# Patient Record
Sex: Female | Born: 1945 | Race: White | Hispanic: No | Marital: Married | Smoking: Never smoker
Health system: Southern US, Community
[De-identification: ages and names within clinical notes are randomized; demographics above are authoritative.]

## PROBLEM LIST (undated history)

## (undated) DIAGNOSIS — M1990 Unspecified osteoarthritis, unspecified site: Secondary | ICD-10-CM

## (undated) DIAGNOSIS — J302 Other seasonal allergic rhinitis: Secondary | ICD-10-CM

## (undated) DIAGNOSIS — J45909 Unspecified asthma, uncomplicated: Secondary | ICD-10-CM

## (undated) DIAGNOSIS — E785 Hyperlipidemia, unspecified: Secondary | ICD-10-CM

## (undated) DIAGNOSIS — K219 Gastro-esophageal reflux disease without esophagitis: Secondary | ICD-10-CM

## (undated) DIAGNOSIS — E119 Type 2 diabetes mellitus without complications: Secondary | ICD-10-CM

## (undated) DIAGNOSIS — M62838 Other muscle spasm: Secondary | ICD-10-CM

## (undated) DIAGNOSIS — I10 Essential (primary) hypertension: Secondary | ICD-10-CM

## (undated) HISTORY — PX: SEPTOPLASTY: SUR1290

---

## 1988-10-23 HISTORY — PX: BREAST SURGERY: SHX581

## 2016-06-14 ENCOUNTER — Other Ambulatory Visit: Payer: Self-pay | Admitting: Gastroenterology

## 2016-06-14 DIAGNOSIS — R1084 Generalized abdominal pain: Secondary | ICD-10-CM

## 2016-06-20 ENCOUNTER — Ambulatory Visit
Admission: RE | Admit: 2016-06-20 | Discharge: 2016-06-20 | Disposition: A | Discharge status: Home / Self Care | Payer: Commercial Managed Care - PPO | Source: Ambulatory Visit | Attending: Gastroenterology | Admitting: Gastroenterology

## 2016-06-20 DIAGNOSIS — R1084 Generalized abdominal pain: Secondary | ICD-10-CM | POA: Insufficient documentation

## 2016-06-20 DIAGNOSIS — K5730 Diverticulosis of large intestine without perforation or abscess without bleeding: Secondary | ICD-10-CM | POA: Diagnosis not present

## 2016-06-20 DIAGNOSIS — I700 Atherosclerosis of aorta: Secondary | ICD-10-CM | POA: Diagnosis not present

## 2016-06-20 HISTORY — DX: Unspecified asthma, uncomplicated: J45.909

## 2016-06-20 HISTORY — DX: Type 2 diabetes mellitus without complications: E11.9

## 2016-06-20 MED ORDER — IOPAMIDOL (ISOVUE-300) INJECTION 61%
100.0000 mL | Freq: Once | INTRAVENOUS | Status: AC | PRN
  Administered 2016-06-20: 100 mL via INTRAVENOUS

## 2016-11-08 ENCOUNTER — Inpatient Hospital Stay: Admission: RE | Admit: 2016-11-08 | Payer: Commercial Managed Care - PPO | Source: Ambulatory Visit

## 2016-11-09 ENCOUNTER — Other Ambulatory Visit: Payer: Self-pay | Admitting: Orthopedic Surgery

## 2016-11-15 ENCOUNTER — Encounter
Admission: RE | Admit: 2016-11-15 | Discharge: 2016-11-15 | Disposition: A | Discharge status: Home / Self Care | Payer: Commercial Managed Care - PPO | Source: Ambulatory Visit | Attending: Orthopedic Surgery | Admitting: Orthopedic Surgery

## 2016-11-15 DIAGNOSIS — Z01812 Encounter for preprocedural laboratory examination: Secondary | ICD-10-CM | POA: Diagnosis not present

## 2016-11-15 HISTORY — DX: Other seasonal allergic rhinitis: J30.2

## 2016-11-15 HISTORY — DX: Other muscle spasm: M62.838

## 2016-11-15 HISTORY — DX: Hyperlipidemia, unspecified: E78.5

## 2016-11-15 HISTORY — DX: Unspecified osteoarthritis, unspecified site: M19.90

## 2016-11-15 HISTORY — DX: Gastro-esophageal reflux disease without esophagitis: K21.9

## 2016-11-15 LAB — CBC WITH DIFFERENTIAL/PLATELET
Basophils Absolute: 0.1 10*3/uL (ref 0–0.1)
Basophils Relative: 1 %
Eosinophils Absolute: 0.2 10*3/uL (ref 0–0.7)
Eosinophils Relative: 2 %
HEMATOCRIT: 39.5 % (ref 35.0–47.0)
Hemoglobin: 13.2 g/dL (ref 12.0–16.0)
LYMPHS PCT: 26 %
Lymphs Abs: 2.8 10*3/uL (ref 1.0–3.6)
MCH: 29.1 pg (ref 26.0–34.0)
MCHC: 33.5 g/dL (ref 32.0–36.0)
MCV: 86.8 fL (ref 80.0–100.0)
MONO ABS: 0.7 10*3/uL (ref 0.2–0.9)
MONOS PCT: 6 %
NEUTROS ABS: 7.1 10*3/uL — AB (ref 1.4–6.5)
Neutrophils Relative %: 65 %
Platelets: 306 10*3/uL (ref 150–440)
RBC: 4.55 MIL/uL (ref 3.80–5.20)
RDW: 14.1 % (ref 11.5–14.5)
WBC: 10.8 10*3/uL (ref 3.6–11.0)

## 2016-11-15 LAB — URINALYSIS, ROUTINE W REFLEX MICROSCOPIC
Bilirubin Urine: NEGATIVE
Glucose, UA: NEGATIVE mg/dL
HGB URINE DIPSTICK: NEGATIVE
KETONES UR: NEGATIVE mg/dL
LEUKOCYTES UA: NEGATIVE
Nitrite: NEGATIVE
PROTEIN: NEGATIVE mg/dL
Specific Gravity, Urine: 1.01 (ref 1.005–1.030)
pH: 5 (ref 5.0–8.0)

## 2016-11-15 LAB — BASIC METABOLIC PANEL
ANION GAP: 8 (ref 5–15)
BUN: 14 mg/dL (ref 6–20)
CHLORIDE: 106 mmol/L (ref 101–111)
CO2: 26 mmol/L (ref 22–32)
Calcium: 9.2 mg/dL (ref 8.9–10.3)
Creatinine, Ser: 0.73 mg/dL (ref 0.44–1.00)
GFR calc Af Amer: >60 mL/min (ref >60)
GFR calc non Af Amer: >60 mL/min (ref >60)
GLUCOSE: 94 mg/dL (ref 65–99)
POTASSIUM: 3.8 mmol/L (ref 3.5–5.1)
Sodium: 140 mmol/L (ref 135–145)

## 2016-11-15 LAB — PROTIME-INR
INR: 0.97
Prothrombin Time: 12.9 seconds (ref 11.4–15.2)

## 2016-11-15 LAB — TYPE AND SCREEN
ABO/RH(D): A POS
Antibody Screen: NEGATIVE

## 2016-11-15 LAB — SURGICAL PCR SCREEN
MRSA, PCR: POSITIVE — AB
Staphylococcus aureus: POSITIVE — AB

## 2016-11-15 LAB — APTT: aPTT: 32 seconds (ref 24–36)

## 2016-11-15 MED ORDER — CHLORHEXIDINE GLUCONATE CLOTH 2 % EX PADS
6.0000 | MEDICATED_PAD | Freq: Once | CUTANEOUS | Status: DC
  Filled 2016-11-15: qty 6

## 2016-11-15 NOTE — Pre-Procedure Instructions (Signed)
Component Name Value Ref Range  Vent Rate (bpm) 80   PR Interval (msec) 158   QRS Interval (msec) 80   QT Interval (msec) 384   QTc (msec) 442   Result Narrative  Normal sinus rhythm Moderate voltage criteria for LVH, may be normal variant Borderline ECG When compared with ECG of 26-May-2016 12:02, No significant change was found I reviewed and concur with this report. Electronically signed ZO:[id number]:CONWAY MD, BENJAMIN ([id number]) on 10/04/2016 7:58:00 PM

## 2016-11-15 NOTE — Pre-Procedure Instructions (Signed)
Positive MRSA, faxed information to Dr Samuel GermanyKrasinski's office.

## 2016-11-15 NOTE — Patient Instructions (Signed)
  Your procedure is scheduled on: 11/21/16 Tues Report to Same Day Surgery 2nd floor medical mall [facility](Medical Mall Entrance-take elevator on left to 2nd floor.  Check in with surgery information desk.) To find out your arrival time please call [phone] between 1PM - 3PM on 11/20/16 Mon  Remember: Instructions that are not followed completely may result in serious medical risk, up to and including death, or upon the discretion of your surgeon and anesthesiologist your surgery may need to be rescheduled.    _x___ 1. Do not eat food or drink liquids after midnight. No gum chewing or hard candies.     __x__ 2. No Alcohol for 24 hours before or after surgery.   __x__3. No Smoking for 24 prior to surgery.   ____  4. Bring all medications with you on the day of surgery if instructed.    __x__ 5. Notify your doctor if there is any change in your medical condition     (cold, fever, infections).     Do not wear jewelry, make-up, hairpins, clips or nail polish.  Do not wear lotions, powders, or perfumes. You may wear deodorant.  Do not shave 48 hours prior to surgery. Men may shave face and neck.  Do not bring valuables to the hospital.    [facility] any belongings or valuables.               Contacts, dentures or bridgework may not be worn into surgery.  Leave your suitcase in the car. After surgery it may be brought to your room.  For patients admitted to the hospital, discharge time is determined by your treatment team.   Patients discharged the day of surgery will not be allowed to drive home.  You will need someone to drive you home and stay with you the night of your procedure.    Please read over the following fact sheets that you were given:   [facility] Pre[facility] and or MRSA Information   _x___ Take these medicines the morning of surgery with A SIP OF WATER:    1. esomeprazole (NEXIUM the night before and morning of  surgery  2.Fluticasone-Salmeterol (ADVAIR   3.  4.  5.  6.  ____Fleets enema or Magnesium Citrate as directed.   _x___ Use CHG Soap or sage wipes as directed on instruction sheet   ____ Use inhalers on the day of surgery and bring to hospital day of surgery  __x__ Stop metformin 2 days prior to surgery    ____ Take 1/2 of usual insulin dose the night before surgery and none on the morning of           surgery.   ____ Stop Aspirin, Coumadin, Pllavix ,Eliquis, Effient, or Pradaxa  x__ Stop Anti-inflammatories such as Advil, Aleve, Ibuprofen, Motrin, Naproxen,          Naprosyn, Goodies powders or aspirin products. Ok to take Tylenol. Stop ibuprofen today.   ____ Stop supplements until after surgery.    ____ Bring C-Pap to the hospital.

## 2016-11-15 NOTE — Pre-Procedure Instructions (Addendum)
Cardiac clearance on chart. Medical clearance on chart.

## 2016-11-16 LAB — HEMOGLOBIN A1C
HEMOGLOBIN A1C: 6.3 % — AB (ref 4.8–5.6)
Mean Plasma Glucose: 134 mg/dL

## 2016-11-21 ENCOUNTER — Inpatient Hospital Stay
Admission: RE | Admit: 2016-11-21 | Discharge: 2016-11-24 | DRG: 470 | Disposition: A | Discharge status: Home Health | Payer: Commercial Managed Care - PPO | Source: Ambulatory Visit | Attending: Orthopedic Surgery | Admitting: Orthopedic Surgery

## 2016-11-21 ENCOUNTER — Inpatient Hospital Stay: Payer: Commercial Managed Care - PPO

## 2016-11-21 ENCOUNTER — Inpatient Hospital Stay: Payer: Commercial Managed Care - PPO | Admitting: Anesthesiology

## 2016-11-21 ENCOUNTER — Encounter: Payer: Self-pay | Admitting: *Deleted

## 2016-11-21 ENCOUNTER — Encounter
Admission: RE | Disposition: A | Discharge status: Home Health | Payer: Self-pay | Source: Ambulatory Visit | Attending: Orthopedic Surgery

## 2016-11-21 DIAGNOSIS — Z96651 Presence of right artificial knee joint: Secondary | ICD-10-CM

## 2016-11-21 DIAGNOSIS — M25561 Pain in right knee: Secondary | ICD-10-CM

## 2016-11-21 DIAGNOSIS — K219 Gastro-esophageal reflux disease without esophagitis: Secondary | ICD-10-CM | POA: Diagnosis present

## 2016-11-21 DIAGNOSIS — J45909 Unspecified asthma, uncomplicated: Secondary | ICD-10-CM | POA: Diagnosis present

## 2016-11-21 DIAGNOSIS — E119 Type 2 diabetes mellitus without complications: Secondary | ICD-10-CM | POA: Diagnosis present

## 2016-11-21 DIAGNOSIS — Z79899 Other long term (current) drug therapy: Secondary | ICD-10-CM

## 2016-11-21 DIAGNOSIS — M1711 Unilateral primary osteoarthritis, right knee: Principal | ICD-10-CM | POA: Diagnosis present

## 2016-11-21 DIAGNOSIS — R262 Difficulty in walking, not elsewhere classified: Secondary | ICD-10-CM

## 2016-11-21 DIAGNOSIS — I10 Essential (primary) hypertension: Secondary | ICD-10-CM | POA: Diagnosis present

## 2016-11-21 DIAGNOSIS — M6281 Muscle weakness (generalized): Secondary | ICD-10-CM

## 2016-11-21 HISTORY — PX: TOTAL KNEE ARTHROPLASTY: SHX125

## 2016-11-21 HISTORY — DX: Essential (primary) hypertension: I10

## 2016-11-21 LAB — GLUCOSE, CAPILLARY
GLUCOSE-CAPILLARY: 192 mg/dL — AB (ref 65–99)
Glucose-Capillary: 141 mg/dL — ABNORMAL HIGH (ref 65–99)
Glucose-Capillary: 178 mg/dL — ABNORMAL HIGH (ref 65–99)

## 2016-11-21 LAB — ABO/RH: ABO/RH(D): A POS

## 2016-11-21 SURGERY — ARTHROPLASTY, KNEE, TOTAL
Anesthesia: General | Site: Knee | Laterality: Right | Wound class: Clean

## 2016-11-21 MED ORDER — NEOMYCIN-POLYMYXIN B GU 40-200000 IR SOLN
Status: DC | PRN
  Administered 2016-11-21: 16 mL

## 2016-11-21 MED ORDER — GABAPENTIN 300 MG PO CAPS
300.0000 mg | ORAL_CAPSULE | Freq: Three times a day (TID) | ORAL | Status: DC
  Administered 2016-11-21 – 2016-11-24 (×8): 300 mg via ORAL
  Filled 2016-11-21 (×8): qty 1

## 2016-11-21 MED ORDER — GLIPIZIDE ER 2.5 MG PO TB24
5.0000 mg | ORAL_TABLET | Freq: Every day | ORAL | Status: DC
  Administered 2016-11-21 – 2016-11-24 (×4): 5 mg via ORAL
  Filled 2016-11-21 (×4): qty 2

## 2016-11-21 MED ORDER — CLINDAMYCIN PHOSPHATE 600 MG/50ML IV SOLN
INTRAVENOUS | Status: AC
  Filled 2016-11-21: qty 50

## 2016-11-21 MED ORDER — MIDAZOLAM HCL 2 MG/2ML IJ SOLN
INTRAMUSCULAR | Status: AC
  Filled 2016-11-21: qty 2

## 2016-11-21 MED ORDER — ALUM & MAG HYDROXIDE-SIMETH 200-200-20 MG/5ML PO SUSP: 30.0000 mL | ORAL | Status: DC | PRN

## 2016-11-21 MED ORDER — FENTANYL CITRATE (PF) 100 MCG/2ML IJ SOLN
INTRAMUSCULAR | Status: AC
  Filled 2016-11-21: qty 2

## 2016-11-21 MED ORDER — POLYVINYL ALCOHOL 1.4 % OP SOLN
Freq: Four times a day (QID) | OPHTHALMIC | Status: DC
  Administered 2016-11-22: 1 [drp] via OPHTHALMIC
  Administered 2016-11-22 – 2016-11-23 (×5): via OPHTHALMIC
  Filled 2016-11-21: qty 15

## 2016-11-21 MED ORDER — DOCUSATE SODIUM 100 MG PO CAPS
100.0000 mg | ORAL_CAPSULE | Freq: Two times a day (BID) | ORAL | Status: DC
  Administered 2016-11-21 – 2016-11-24 (×7): 100 mg via ORAL
  Filled 2016-11-21 (×7): qty 1

## 2016-11-21 MED ORDER — ENOXAPARIN SODIUM 30 MG/0.3ML ~~LOC~~ SOLN
30.0000 mg | Freq: Two times a day (BID) | SUBCUTANEOUS | Status: DC
  Administered 2016-11-22 – 2016-11-24 (×5): 30 mg via SUBCUTANEOUS
  Filled 2016-11-21 (×5): qty 0.3

## 2016-11-21 MED ORDER — METHOCARBAMOL 500 MG PO TABS
500.0000 mg | ORAL_TABLET | Freq: Four times a day (QID) | ORAL | Status: DC | PRN
Start: 2016-11-21 — End: 2016-11-24
  Administered 2016-11-21 – 2016-11-22 (×2): 500 mg via ORAL
  Filled 2016-11-21 (×2): qty 1

## 2016-11-21 MED ORDER — ONDANSETRON HCL 4 MG PO TABS: 4.0000 mg | ORAL_TABLET | Freq: Four times a day (QID) | ORAL | Status: DC | PRN

## 2016-11-21 MED ORDER — VANCOMYCIN HCL 1000 MG IV SOLR
INTRAVENOUS | Status: DC | PRN
  Administered 2016-11-21: 1000 mg via INTRAVENOUS

## 2016-11-21 MED ORDER — SODIUM CHLORIDE FLUSH 0.9 % IV SOLN
INTRAVENOUS | Status: AC
  Filled 2016-11-21: qty 6

## 2016-11-21 MED ORDER — PROPOFOL 500 MG/50ML IV EMUL
INTRAVENOUS | Status: AC
  Filled 2016-11-21: qty 50

## 2016-11-21 MED ORDER — LIDOCAINE HCL (CARDIAC) 20 MG/ML IV SOLN
INTRAVENOUS | Status: DC | PRN
  Administered 2016-11-21: 80 mg via INTRAVENOUS

## 2016-11-21 MED ORDER — MORPHINE SULFATE (PF) 2 MG/ML IV SOLN: 2.0000 mg | INTRAVENOUS | Status: DC | PRN

## 2016-11-21 MED ORDER — CEFAZOLIN SODIUM-DEXTROSE 2-4 GM/100ML-% IV SOLN
INTRAVENOUS | Status: AC
  Filled 2016-11-21: qty 100

## 2016-11-21 MED ORDER — ROCURONIUM BROMIDE 100 MG/10ML IV SOLN
INTRAVENOUS | Status: DC | PRN
  Administered 2016-11-21: 45 mg via INTRAVENOUS
  Administered 2016-11-21: 5 mg via INTRAVENOUS
  Administered 2016-11-21: 20 mg via INTRAVENOUS

## 2016-11-21 MED ORDER — MOMETASONE FURO-FORMOTEROL FUM 100-5 MCG/ACT IN AERO
2.0000 | INHALATION_SPRAY | Freq: Two times a day (BID) | RESPIRATORY_TRACT | Status: DC
  Administered 2016-11-21 – 2016-11-24 (×6): 2 via RESPIRATORY_TRACT
  Filled 2016-11-21: qty 8.8

## 2016-11-21 MED ORDER — CEFAZOLIN SODIUM-DEXTROSE 2-4 GM/100ML-% IV SOLN
2.0000 g | Freq: Four times a day (QID) | INTRAVENOUS | Status: DC
  Filled 2016-11-21 (×2): qty 100

## 2016-11-21 MED ORDER — ONDANSETRON HCL 4 MG/2ML IJ SOLN
INTRAMUSCULAR | Status: AC
  Filled 2016-11-21: qty 2

## 2016-11-21 MED ORDER — CLOBETASOL PROPIONATE 0.05 % EX OINT
1.0000 "application " | TOPICAL_OINTMENT | Freq: Two times a day (BID) | CUTANEOUS | Status: DC | PRN
  Filled 2016-11-21: qty 15

## 2016-11-21 MED ORDER — METFORMIN HCL ER 500 MG PO TB24
500.0000 mg | ORAL_TABLET | Freq: Two times a day (BID) | ORAL | Status: DC
  Administered 2016-11-22 – 2016-11-24 (×5): 500 mg via ORAL
  Filled 2016-11-21 (×5): qty 1

## 2016-11-21 MED ORDER — SYSTANE LID WIPES EX PADS: MEDICATED_PAD | Freq: Four times a day (QID) | CUTANEOUS | Status: DC

## 2016-11-21 MED ORDER — METOCLOPRAMIDE HCL 10 MG PO TABS: 5.0000 mg | ORAL_TABLET | Freq: Three times a day (TID) | ORAL | Status: DC | PRN

## 2016-11-21 MED ORDER — SUCCINYLCHOLINE CHLORIDE 20 MG/ML IJ SOLN
INTRAMUSCULAR | Status: DC | PRN
  Administered 2016-11-21: 100 mg via INTRAVENOUS

## 2016-11-21 MED ORDER — KETOROLAC TROMETHAMINE 30 MG/ML IJ SOLN
INTRAMUSCULAR | Status: AC
  Filled 2016-11-21: qty 1

## 2016-11-21 MED ORDER — VITAMIN D 1000 UNITS PO TABS
2000.0000 [IU] | ORAL_TABLET | Freq: Every day | ORAL | Status: DC
  Administered 2016-11-21 – 2016-11-24 (×4): 2000 [IU] via ORAL
  Filled 2016-11-21 (×4): qty 2

## 2016-11-21 MED ORDER — SODIUM CHLORIDE 0.9 % IV SOLN
INTRAVENOUS | Status: DC
  Administered 2016-11-21: 50 mL/h via INTRAVENOUS

## 2016-11-21 MED ORDER — FERROUS SULFATE 325 (65 FE) MG PO TABS
325.0000 mg | ORAL_TABLET | Freq: Three times a day (TID) | ORAL | Status: DC
  Administered 2016-11-21 – 2016-11-24 (×8): 325 mg via ORAL
  Filled 2016-11-21 (×8): qty 1

## 2016-11-21 MED ORDER — BUPIVACAINE LIPOSOME 1.3 % IJ SUSP
INTRAMUSCULAR | Status: AC
  Filled 2016-11-21: qty 20

## 2016-11-21 MED ORDER — MAGNESIUM CITRATE PO SOLN
1.0000 | Freq: Once | ORAL | Status: DC | PRN
  Filled 2016-11-21 (×2): qty 296

## 2016-11-21 MED ORDER — MENTHOL 3 MG MT LOZG
1.0000 | LOZENGE | OROMUCOSAL | Status: DC | PRN
  Filled 2016-11-21: qty 9

## 2016-11-21 MED ORDER — NEOMYCIN-POLYMYXIN B GU 40-200000 IR SOLN
Status: AC
  Filled 2016-11-21: qty 20

## 2016-11-21 MED ORDER — MIDAZOLAM HCL 2 MG/2ML IJ SOLN
INTRAMUSCULAR | Status: DC | PRN
  Administered 2016-11-21: 2 mg via INTRAVENOUS

## 2016-11-21 MED ORDER — PHENYLEPHRINE HCL 10 MG/ML IJ SOLN
INTRAMUSCULAR | Status: DC | PRN
  Administered 2016-11-21 (×5): 50 ug via INTRAVENOUS

## 2016-11-21 MED ORDER — FENTANYL CITRATE (PF) 100 MCG/2ML IJ SOLN
INTRAMUSCULAR | Status: DC | PRN
  Administered 2016-11-21 (×2): 50 ug via INTRAVENOUS
  Administered 2016-11-21: 100 ug via INTRAVENOUS
  Administered 2016-11-21: 50 ug via INTRAVENOUS
  Administered 2016-11-21: 25 ug via INTRAVENOUS

## 2016-11-21 MED ORDER — SODIUM CHLORIDE 0.9 % IV SOLN
INTRAVENOUS | Status: DC
  Administered 2016-11-21: 14:00:00 via INTRAVENOUS

## 2016-11-21 MED ORDER — PHENOL 1.4 % MT LIQD
1.0000 | OROMUCOSAL | Status: DC | PRN
  Filled 2016-11-21: qty 177

## 2016-11-21 MED ORDER — HYDROMORPHONE HCL 1 MG/ML IJ SOLN
INTRAMUSCULAR | Status: AC
  Filled 2016-11-21: qty 1

## 2016-11-21 MED ORDER — ACETAMINOPHEN 10 MG/ML IV SOLN
INTRAVENOUS | Status: AC
  Filled 2016-11-21: qty 100

## 2016-11-21 MED ORDER — PHENYLEPHRINE HCL 10 MG/ML IJ SOLN
INTRAMUSCULAR | Status: AC
  Filled 2016-11-21: qty 1

## 2016-11-21 MED ORDER — ONDANSETRON HCL 4 MG/2ML IJ SOLN
4.0000 mg | Freq: Once | INTRAMUSCULAR | Status: AC
  Administered 2016-11-21: 4 mg via INTRAVENOUS

## 2016-11-21 MED ORDER — HYDROMORPHONE HCL 1 MG/ML IJ SOLN
INTRAMUSCULAR | Status: AC
  Administered 2016-11-21: 0.25 mg via INTRAVENOUS
  Filled 2016-11-21: qty 1

## 2016-11-21 MED ORDER — ACETAMINOPHEN 325 MG PO TABS
650.0000 mg | ORAL_TABLET | Freq: Four times a day (QID) | ORAL | Status: DC | PRN
  Administered 2016-11-22: 650 mg via ORAL
  Filled 2016-11-21: qty 2

## 2016-11-21 MED ORDER — ATORVASTATIN CALCIUM 10 MG PO TABS
10.0000 mg | ORAL_TABLET | Freq: Every evening | ORAL | Status: DC
  Administered 2016-11-22 – 2016-11-23 (×2): 10 mg via ORAL
  Filled 2016-11-21 (×2): qty 1

## 2016-11-21 MED ORDER — CELECOXIB 200 MG PO CAPS
200.0000 mg | ORAL_CAPSULE | Freq: Two times a day (BID) | ORAL | Status: DC
  Administered 2016-11-21 – 2016-11-24 (×6): 200 mg via ORAL
  Filled 2016-11-21 (×6): qty 1

## 2016-11-21 MED ORDER — BUPIVACAINE-EPINEPHRINE (PF) 0.25% -1:200000 IJ SOLN
INTRAMUSCULAR | Status: AC
  Filled 2016-11-21: qty 30

## 2016-11-21 MED ORDER — VITAMIN B-12 1000 MCG PO TABS
1000.0000 ug | ORAL_TABLET | Freq: Every day | ORAL | Status: DC
  Administered 2016-11-21 – 2016-11-24 (×4): 1000 ug via ORAL
  Filled 2016-11-21 (×4): qty 1

## 2016-11-21 MED ORDER — ACETAMINOPHEN 650 MG RE SUPP: 650.0000 mg | Freq: Four times a day (QID) | RECTAL | Status: DC | PRN

## 2016-11-21 MED ORDER — POLYETHYLENE GLYCOL 3350 17 G PO PACK
17.0000 g | PACK | Freq: Every day | ORAL | Status: DC | PRN
  Administered 2016-11-23: 17 g via ORAL
  Filled 2016-11-21: qty 1

## 2016-11-21 MED ORDER — HYDROMORPHONE HCL 1 MG/ML IJ SOLN
0.2500 mg | INTRAMUSCULAR | Status: DC | PRN
Start: 2016-11-21 — End: 2016-11-21
  Administered 2016-11-21 (×6): 0.25 mg via INTRAVENOUS

## 2016-11-21 MED ORDER — SUGAMMADEX SODIUM 200 MG/2ML IV SOLN
INTRAVENOUS | Status: DC | PRN
  Administered 2016-11-21: 160 mg via INTRAVENOUS

## 2016-11-21 MED ORDER — SUCCINYLCHOLINE CHLORIDE 200 MG/10ML IV SOSY
PREFILLED_SYRINGE | INTRAVENOUS | Status: AC
  Filled 2016-11-21: qty 10

## 2016-11-21 MED ORDER — SUGAMMADEX SODIUM 200 MG/2ML IV SOLN
INTRAVENOUS | Status: AC
  Filled 2016-11-21: qty 2

## 2016-11-21 MED ORDER — LIDOCAINE HCL (PF) 2 % IJ SOLN
INTRAMUSCULAR | Status: AC
  Filled 2016-11-21: qty 2

## 2016-11-21 MED ORDER — CLINDAMYCIN PHOSPHATE 600 MG/50ML IV SOLN
600.0000 mg | Freq: Once | INTRAVENOUS | Status: AC
  Administered 2016-11-21: 600 mg via INTRAVENOUS

## 2016-11-21 MED ORDER — ONDANSETRON HCL 4 MG/2ML IJ SOLN: 4.0000 mg | Freq: Four times a day (QID) | INTRAMUSCULAR | Status: DC | PRN

## 2016-11-21 MED ORDER — ACETAMINOPHEN 10 MG/ML IV SOLN
INTRAVENOUS | Status: DC | PRN
  Administered 2016-11-21: 1000 mg via INTRAVENOUS

## 2016-11-21 MED ORDER — OXYCODONE HCL 5 MG/5ML PO SOLN: 5.0000 mg | Freq: Once | ORAL | Status: DC | PRN

## 2016-11-21 MED ORDER — MORPHINE SULFATE 4 MG/ML IJ SOLN
INTRAMUSCULAR | Status: DC | PRN
  Administered 2016-11-21: 32 mL via INTRA_ARTICULAR

## 2016-11-21 MED ORDER — PROPOFOL 10 MG/ML IV BOLUS
INTRAVENOUS | Status: DC | PRN
  Administered 2016-11-21: 140 mg via INTRAVENOUS

## 2016-11-21 MED ORDER — MORPHINE SULFATE (PF) 4 MG/ML IV SOLN
INTRAVENOUS | Status: AC
  Filled 2016-11-21: qty 1

## 2016-11-21 MED ORDER — METHOCARBAMOL 1000 MG/10ML IJ SOLN
500.0000 mg | Freq: Four times a day (QID) | INTRAVENOUS | Status: DC | PRN
  Filled 2016-11-21: qty 5

## 2016-11-21 MED ORDER — FENTANYL CITRATE (PF) 100 MCG/2ML IJ SOLN
25.0000 ug | INTRAMUSCULAR | Status: DC | PRN
  Administered 2016-11-21 (×2): 50 ug via INTRAVENOUS
  Administered 2016-11-21 (×2): 25 ug via INTRAVENOUS

## 2016-11-21 MED ORDER — VANCOMYCIN HCL IN DEXTROSE 1-5 GM/200ML-% IV SOLN
INTRAVENOUS | Status: AC
  Filled 2016-11-21: qty 200

## 2016-11-21 MED ORDER — MUPIROCIN 2 % EX OINT
1.0000 "application " | TOPICAL_OINTMENT | Freq: Three times a day (TID) | CUTANEOUS | Status: DC
  Administered 2016-11-21 – 2016-11-23 (×6): 1 via NASAL
  Filled 2016-11-21: qty 22

## 2016-11-21 MED ORDER — OXYCODONE HCL 5 MG PO TABS: 5.0000 mg | ORAL_TABLET | Freq: Once | ORAL | Status: DC | PRN

## 2016-11-21 MED ORDER — BISACODYL 5 MG PO TBEC
5.0000 mg | DELAYED_RELEASE_TABLET | Freq: Every day | ORAL | Status: DC | PRN
  Administered 2016-11-24: 5 mg via ORAL
  Filled 2016-11-21: qty 1

## 2016-11-21 MED ORDER — ALBUTEROL SULFATE HFA 108 (90 BASE) MCG/ACT IN AERS: 1.0000 | INHALATION_SPRAY | Freq: Four times a day (QID) | RESPIRATORY_TRACT | Status: DC | PRN

## 2016-11-21 MED ORDER — ZOLPIDEM TARTRATE 5 MG PO TABS: 5.0000 mg | ORAL_TABLET | Freq: Every evening | ORAL | Status: DC | PRN

## 2016-11-21 MED ORDER — CEFAZOLIN SODIUM-DEXTROSE 2-3 GM-% IV SOLR
2.0000 g | Freq: Four times a day (QID) | INTRAVENOUS | Status: DC
  Administered 2016-11-21 – 2016-11-23 (×8): 2 g via INTRAVENOUS
  Filled 2016-11-21 (×13): qty 50

## 2016-11-21 MED ORDER — FLUOROURACIL 5 % EX CREA: 1.0000 "application " | TOPICAL_CREAM | Freq: Every day | CUTANEOUS | Status: DC | PRN

## 2016-11-21 MED ORDER — ALBUTEROL SULFATE (2.5 MG/3ML) 0.083% IN NEBU: 2.5000 mg | INHALATION_SOLUTION | Freq: Four times a day (QID) | RESPIRATORY_TRACT | Status: DC | PRN

## 2016-11-21 MED ORDER — ONDANSETRON HCL 4 MG/2ML IJ SOLN
INTRAMUSCULAR | Status: DC | PRN
  Administered 2016-11-21: 4 mg via INTRAVENOUS

## 2016-11-21 MED ORDER — METOCLOPRAMIDE HCL 5 MG/ML IJ SOLN: 5.0000 mg | Freq: Three times a day (TID) | INTRAMUSCULAR | Status: DC | PRN

## 2016-11-21 MED ORDER — SODIUM CHLORIDE 0.9 % IV SOLN
INTRAVENOUS | Status: DC | PRN
  Administered 2016-11-21: 120 mL

## 2016-11-21 MED ORDER — OXYCODONE HCL 5 MG PO TABS
5.0000 mg | ORAL_TABLET | ORAL | Status: DC | PRN
Start: 2016-11-21 — End: 2016-11-22
  Administered 2016-11-21: 10 mg via ORAL
  Administered 2016-11-21: 5 mg via ORAL
  Administered 2016-11-21 – 2016-11-22 (×2): 10 mg via ORAL
  Administered 2016-11-22: 5 mg via ORAL
  Filled 2016-11-21: qty 2
  Filled 2016-11-21: qty 1
  Filled 2016-11-21: qty 2
  Filled 2016-11-21: qty 1
  Filled 2016-11-21: qty 2

## 2016-11-21 MED ORDER — LINAGLIPTIN 5 MG PO TABS
5.0000 mg | ORAL_TABLET | Freq: Every day | ORAL | Status: DC
  Administered 2016-11-22 – 2016-11-24 (×3): 5 mg via ORAL
  Filled 2016-11-21 (×3): qty 1

## 2016-11-21 MED ORDER — ROCURONIUM BROMIDE 50 MG/5ML IV SOSY
PREFILLED_SYRINGE | INTRAVENOUS | Status: AC
  Filled 2016-11-21: qty 5

## 2016-11-21 MED ORDER — CEFAZOLIN SODIUM-DEXTROSE 2-4 GM/100ML-% IV SOLN
2.0000 g | INTRAVENOUS | Status: AC
  Administered 2016-11-21: 2 g via INTRAVENOUS

## 2016-11-21 MED ORDER — DEXAMETHASONE SODIUM PHOSPHATE 10 MG/ML IJ SOLN
INTRAMUSCULAR | Status: AC
  Filled 2016-11-21: qty 1

## 2016-11-21 MED ORDER — PANTOPRAZOLE SODIUM 40 MG PO TBEC
40.0000 mg | DELAYED_RELEASE_TABLET | Freq: Every day | ORAL | Status: DC
  Administered 2016-11-22 – 2016-11-24 (×3): 40 mg via ORAL
  Filled 2016-11-21 (×3): qty 1

## 2016-11-21 MED ORDER — DIPHENHYDRAMINE HCL 12.5 MG/5ML PO ELIX: 12.5000 mg | ORAL_SOLUTION | ORAL | Status: DC | PRN

## 2016-11-21 SURGICAL SUPPLY — 77 items
AUTOTRANSFUS HAS 1/8 (MISCELLANEOUS) ×3
BAG DECANTER FOR FLEXI CONT (MISCELLANEOUS) ×3 IMPLANT
BLADE DEBAKEY 8.0 (BLADE) ×2 IMPLANT
BLADE DEBAKEY 8.0MM (BLADE) ×1
BLADE SAW 1 (BLADE) ×3 IMPLANT
BLADE SAW 1/2 (BLADE) ×3 IMPLANT
BLADE SURG 15 STRL LF DISP TIS (BLADE) ×1 IMPLANT
BLADE SURG 15 STRL SS (BLADE) ×2
CANISTER SUCT 1200ML W/VALVE (MISCELLANEOUS) ×6 IMPLANT
CAP KNEE TOTAL 3 SIGMA ×3 IMPLANT
CATH TRAY METER 16FR LF (MISCELLANEOUS) ×3 IMPLANT
CEMENT HV SMART SET (Cement) ×6 IMPLANT
CNTNR SPEC 2.5X3XGRAD LEK (MISCELLANEOUS) ×1
CONT SPEC 4OZ STER OR WHT (MISCELLANEOUS) ×2
CONTAINER SPEC 2.5X3XGRAD LEK (MISCELLANEOUS) ×1 IMPLANT
COOLER POLAR GLACIER W/PUMP (MISCELLANEOUS) ×3 IMPLANT
CUFF TOURN 24 STER (MISCELLANEOUS) ×3 IMPLANT
CUFF TOURN 30 STER DUAL PORT (MISCELLANEOUS) IMPLANT
DECANTER SPIKE VIAL GLASS SM (MISCELLANEOUS) ×3 IMPLANT
DRAPE IMP U-DRAPE 54X76 (DRAPES) ×3 IMPLANT
DRAPE INCISE IOBAN 66X60 STRL (DRAPES) ×3 IMPLANT
DRAPE SHEET LG 3/4 BI-LAMINATE (DRAPES) ×6 IMPLANT
DRAPE SURG 17X11 SM STRL (DRAPES) ×6 IMPLANT
DRSG OPSITE POSTOP 4X12 (GAUZE/BANDAGES/DRESSINGS) ×3 IMPLANT
DRSG OPSITE POSTOP 4X14 (GAUZE/BANDAGES/DRESSINGS) IMPLANT
DURAPREP 26ML APPLICATOR (WOUND CARE) ×9 IMPLANT
ELECT REM PT RETURN 9FT ADLT (ELECTROSURGICAL) ×6
ELECTRODE REM PT RTRN 9FT ADLT (ELECTROSURGICAL) ×2 IMPLANT
GAUZE PETRO XEROFOAM 1X8 (MISCELLANEOUS) ×3 IMPLANT
GAUZE SPONGE 4X4 12PLY STRL (GAUZE/BANDAGES/DRESSINGS) ×3 IMPLANT
GLOVE BIO SURGEON STRL SZ8.5 (GLOVE) ×9 IMPLANT
GLOVE BIOGEL PI IND STRL 7.0 (GLOVE) ×3 IMPLANT
GLOVE BIOGEL PI IND STRL 8 (GLOVE) ×2 IMPLANT
GLOVE BIOGEL PI IND STRL 9 (GLOVE) ×1 IMPLANT
GLOVE BIOGEL PI INDICATOR 7.0 (GLOVE) ×6
GLOVE BIOGEL PI INDICATOR 8 (GLOVE) ×4
GLOVE BIOGEL PI INDICATOR 9 (GLOVE) ×2
GLOVE SURG 9.0 ORTHO LTXF (GLOVE) ×9 IMPLANT
GOWN STRL REUS TWL 2XL XL LVL4 (GOWN DISPOSABLE) ×3 IMPLANT
GOWN STRL REUS W/ TWL LRG LVL3 (GOWN DISPOSABLE) ×5 IMPLANT
GOWN STRL REUS W/TWL LRG LVL3 (GOWN DISPOSABLE) ×10
GOWN STRL REUS W/TWL XL LVL4 (GOWN DISPOSABLE) ×3 IMPLANT
HANDPIECE INTERPULSE COAX TIP (DISPOSABLE) ×2
HOLDER FOLEY CATH W/STRAP (MISCELLANEOUS) ×3 IMPLANT
IMMBOLIZER KNEE 19 BLUE UNIV (SOFTGOODS) ×3 IMPLANT
IV NS 100ML SINGLE PACK (IV SOLUTION) ×3 IMPLANT
KIT RM TURNOVER STRD PROC AR (KITS) ×3 IMPLANT
NDL HPO THNWL 1X22GA REG BVL (NEEDLE) ×1 IMPLANT
NDL SAFETY 18GX1.5 (NEEDLE) ×3 IMPLANT
NDL SAFETY 22GX1.5 (NEEDLE) ×3 IMPLANT
NEEDLE FILTER BLUNT 18X 1/2SAF (NEEDLE) ×2
NEEDLE FILTER BLUNT 18X1 1/2 (NEEDLE) ×1 IMPLANT
NEEDLE SAFETY 22GX1 (NEEDLE) ×2
NEEDLE SPNL 20GX3.5 QUINCKE YW (NEEDLE) ×3 IMPLANT
NS IRRIG 1000ML POUR BTL (IV SOLUTION) ×3 IMPLANT
PACK TOTAL KNEE (MISCELLANEOUS) ×3 IMPLANT
PAD PREP 24X41 OB/GYN DISP (PERSONAL CARE ITEMS) ×3 IMPLANT
PAD WRAPON POLAR KNEE (MISCELLANEOUS) ×1 IMPLANT
SET HNDPC FAN SPRY TIP SCT (DISPOSABLE) ×1 IMPLANT
SOL .9 NS 3000ML IRR  AL (IV SOLUTION) ×2
SOL .9 NS 3000ML IRR UROMATIC (IV SOLUTION) ×1 IMPLANT
SPONGE DRAIN TRACH 4X4 STRL 2S (GAUZE/BANDAGES/DRESSINGS) ×3 IMPLANT
SPONGE LAP 18X18 5 PK (GAUZE/BANDAGES/DRESSINGS) ×3 IMPLANT
STAPLER SKIN PROX 35W (STAPLE) ×3 IMPLANT
SUCTION FRAZIER HANDLE 10FR (MISCELLANEOUS) ×2
SUCTION TUBE FRAZIER 10FR DISP (MISCELLANEOUS) ×1 IMPLANT
SUT ETHIBOND NAB CT1 #1 30IN (SUTURE) ×6 IMPLANT
SUT MNCRL AB 3-0 PS2 27 (SUTURE) ×6 IMPLANT
SUT VIC AB 0 CT1 36 (SUTURE) ×3 IMPLANT
SUT VIC AB 2-0 CT1 (SUTURE) ×6 IMPLANT
SYR 20CC LL (SYRINGE) ×6 IMPLANT
SYR 3ML LL SCALE MARK (SYRINGE) ×3 IMPLANT
SYR 50ML LL SCALE MARK (SYRINGE) ×6 IMPLANT
SYSTEM AUTOTRANSFUS DUAL TROCR (MISCELLANEOUS) ×1 IMPLANT
TOWER CARTRIDGE SMART MIX (DISPOSABLE) ×3 IMPLANT
TUBE SUCT KAM VAC (TUBING) ×3 IMPLANT
WRAPON POLAR PAD KNEE (MISCELLANEOUS) ×3

## 2016-11-21 NOTE — Anesthesia Postprocedure Evaluation (Signed)
Anesthesia Post Note  Patient: Glynis SmilesMary Durell  Procedure(s) Performed: Procedure(s) (LRB): TOTAL KNEE ARTHROPLASTY (Right)  Patient location during evaluation: PACU Anesthesia Type: General Level of consciousness: awake and alert Pain management: pain level controlled Vital Signs Assessment: post-procedure vital signs reviewed and stable Respiratory status: spontaneous breathing, nonlabored ventilation, respiratory function stable and patient connected to nasal cannula oxygen Cardiovascular status: blood pressure returned to baseline and stable Postop Assessment: no signs of nausea or vomiting Anesthetic complications: no     Last Vitals:  Vitals:   11/21/16 1315 11/21/16 1330  BP: (!) 124/58 98/75  Pulse: 88 91  Resp: 14 16  Temp:  36.6 C    Last Pain:  Vitals:   11/21/16 1330  TempSrc:   PainSc: 4                  Joseph K Piscitello

## 2016-11-21 NOTE — H&P (Signed)
The patient has been re-examined, and the chart reviewed, and there have been no interval changes to the documented history and physical.    The risks, benefits, and alternatives have been discussed at length, and the patient is willing to proceed.   

## 2016-11-21 NOTE — Transfer of Care (Signed)
Immediate Anesthesia Transfer of Care Note  Patient: Janet SmilesMary Robles  Procedure(s) Performed: Procedure(s): TOTAL KNEE ARTHROPLASTY (Right)  Patient Location: PACU  Anesthesia Type:General  Level of Consciousness: sedated and responds to stimulation  Airway & Oxygen Therapy: Patient Spontanous Breathing and Patient connected to face mask oxygen  Post-op Assessment: Report given to RN and Post -op Vital signs reviewed and stable  Post vital signs: Reviewed and stable  Last Vitals:  Vitals:   11/21/16 1115 11/21/16 1117  BP: 138/60 138/60  Pulse: 95 97  Resp: 15 17  Temp: 37.1 C     Last Pain:  Vitals:   11/21/16 0624  TempSrc: Oral  PainSc: 3       Patients Stated Pain Goal: 0 (11/21/16 [id number])  Complications: No apparent anesthesia complications

## 2016-11-21 NOTE — Anesthesia Preprocedure Evaluation (Signed)
Anesthesia Evaluation  Patient identified by MRN, date of birth, ID band Patient awake    Reviewed: Allergy & Precautions, H&P , NPO status , Patient's Chart, lab work & pertinent test results  History of Anesthesia Complications (+) POST - OP SPINAL HEADACHE and history of anesthetic complications  Airway Mallampati: III  TM Distance: <3 FB Neck ROM: limited    Dental  (+) Poor Dentition, Chipped   Pulmonary neg shortness of breath, asthma ,    Pulmonary exam normal breath sounds clear to auscultation       Cardiovascular Exercise Tolerance: Good hypertension, (-) angina(-) Past MI and (-) DOE Normal cardiovascular exam Rhythm:regular Rate:Normal     Neuro/Psych  Neuromuscular disease negative psych ROS   GI/Hepatic Neg liver ROS, GERD  Controlled,  Endo/Other  diabetes, Type 2  Renal/GU      Musculoskeletal  (+) Arthritis ,   Abdominal   Peds  Hematology negative hematology ROS (+)   Anesthesia Other Findings Past Medical History: No date: Arthritis No date: Asthma No date: Diabetes mellitus without complication (HCC) No date: Elevated lipids No date: GERD (gastroesophageal reflux disease) No date: Hypertension     Comment: patient states she is borderline hypertensive               and may start meds after surgery No date: Muscle spasms of neck No date: Seasonal allergies  Past Surgical History: 1990: BREAST SURGERY Left     Comment: biopsy 1990: BREAST SURGERY Right     Comment: benign mass No date: CESAREAN SECTION No date: SEPTOPLASTY  BMI    Body Mass Index:  28.73 kg/m      Reproductive/Obstetrics negative OB ROS                             Anesthesia Physical Anesthesia Plan  ASA: III  Anesthesia Plan: General ETT   Post-op Pain Management:    Induction:   Airway Management Planned:   Additional Equipment:   Intra-op Plan:   Post-operative Plan:    Informed Consent: I have reviewed the patients History and Physical, chart, labs and discussed the procedure including the risks, benefits and alternatives for the proposed anesthesia with the patient or authorized representative who has indicated his/her understanding and acceptance.   Dental Advisory Given  Plan Discussed with: Anesthesiologist, CRNA and Surgeon  Anesthesia Plan Comments: (Patient reports multiple spinal headaches in the past. She is very concerned about this.  I informed her that this was probably from needle diameter and the risk would be very low for her having another one but that we can do GA instead to avoid this risk.)        Anesthesia Quick Evaluation

## 2016-11-21 NOTE — Anesthesia Procedure Notes (Signed)
Procedure Name: Intubation Performed by: Casey BurkittHOANG, Gyselle Matthew Pre-anesthesia Checklist: Patient identified, Patient being monitored, Timeout performed, Emergency Drugs available and Suction available Patient Re-evaluated:Patient Re-evaluated prior to inductionOxygen Delivery Method: Circle system utilized Preoxygenation: Pre-oxygenation with 100% oxygen Intubation Type: IV induction Ventilation: Mask ventilation without difficulty Laryngoscope Size: 3 and Glidescope Grade View: Grade I Tube type: Oral Tube size: 7.0 mm Number of attempts: 1 Airway Equipment and Method: Stylet and Video-laryngoscopy Placement Confirmation: ETT inserted through vocal cords under direct vision,  positive ETCO2 and breath sounds checked- equal and bilateral Secured at: 20 cm Tube secured with: Tape Dental Injury: Teeth and Oropharynx as per pre-operative assessment  Difficulty Due To: Difficulty was anticipated and Difficult Airway- due to anterior larynx Future Recommendations: Recommend- induction with short-acting agent, and alternative techniques readily available

## 2016-11-21 NOTE — Progress Notes (Signed)
Pt arrived from pacu via bed at approx 1350. Pt drowsy at that time but alert and oriented when awake. o2 on at [facility] initially but then was dc'd as pt recovered. Lungs clear bilat, HR is regular, abdomen is soft, bs hypoactive. Pt had foley draining clear yellow urine,ppp, foot pumps on bilat. Pt's R knee has immobilizer in place with polar care, autovac in place as well and draining bloody fluid. PIV #20 intact to l hand and to L fa, both sites free of redness and swelling, iv ns infusing per md order. This Clinical research associatewriter reviewed meds ordered with pt. Pt refused celebrex, stating she had taken it in the past and had palpitations as a result. Pt also refused neurontin, stating she does not take this at home, and refused tradjenta, stating she does not take this at home either. Pt's autovac at 4 hours had drained 500mls, this writer transfused a total of 474mls of this back into the patient per protocol, and second autovac was initiated at 1505. Pt tolerated this well. Pt had some emesis after arrival x2, then nausea resolved, pt able to tolerate clear liquids. Pt received oxycodone 1 tab at approx 1630 for increasing pain to R knee described as a dull ache. Pt oriented to room and call bell, and to further doctor orders.

## 2016-11-21 NOTE — Progress Notes (Signed)
PT Cancellation Note  Patient Details Name: Janet SmilesMary Ferraris MRN: [id number] DOB: 1946-03-12   Cancelled Treatment:    Reason Eval/Treat Not Completed: Other (comment). Consult received and chart reviewed. Pt currently with R LE decreased sensation and is getting autovac. Pt also reports nausea/vomiting. Unable to participate with PT at this time. Will re-attempt if able.   Glenice Ciccone 11/21/2016, 3:36 PM  Elizabeth PalauStephanie Anjolie Majer, PT, DPT [phone]

## 2016-11-21 NOTE — NC FL2 (Signed)
[place] MEDICAID FL2 LEVEL OF CARE SCREENING TOOL     IDENTIFICATION  Patient Name: Janet Robles Birthdate: 09-22-1946 Sex: female Admission Date (Current Location): 11/21/2016  [place] Number:  Chiropodist and Address:  [facility], [address] [place], [place] [address]      Provider Number: [id number]  Attending Physician Name and Address:  Juanell Fairly, MD  Relative Name and Phone Number:       Current Level of Care: Hospital Recommended Level of Care: Skilled Nursing Facility Prior Approval Number:    Date Approved/Denied:   PASRR Number:  [id number])  Discharge Plan: SNF    Current Diagnoses: Patient Active Problem List   Diagnosis Date Noted  . S/P total knee arthroplasty, right 11/21/2016    Orientation RESPIRATION BLADDER Height & Weight     Self, Time, Situation, Place  Normal Incontinent Weight: 178 lb (80.7 kg) Height:  5\' 6"  (167.6 cm)  BEHAVIORAL SYMPTOMS/MOOD NEUROLOGICAL BOWEL NUTRITION STATUS   (None.)  (None. ) Incontinent Diet (Diet: Clear Liquid)  AMBULATORY STATUS COMMUNICATION OF NEEDS Skin   Extensive Assist Verbally Surgical wounds (Incision: Right Knee)                       Personal Care Assistance Level of Assistance  Bathing, Feeding, Dressing Bathing Assistance: Limited assistance Feeding assistance: Independent Dressing Assistance: Limited assistance     Functional Limitations Info  Sight, Hearing, Speech Sight Info: Adequate Hearing Info: Adequate Speech Info: Adequate    SPECIAL CARE FACTORS FREQUENCY  PT (By licensed PT), OT (By licensed OT)     PT Frequency:  (5) OT Frequency:  (5)            Contractures      Additional Factors Info  Code Status, Allergies Code Status Info:  (Full Code) Allergies Info:  (No Known Allergies)           Current Medications (11/21/2016):  This is the current hospital active medication list Current  Facility-Administered Medications  Medication Dose Route Frequency Provider Last Rate Last Dose  . 0.9 %  sodium chloride infusion   Intravenous Continuous Juanell Fairly, MD 75 mL/hr at 11/21/16 1409    . acetaminophen (TYLENOL) tablet 650 mg  650 mg Oral Q6H PRN Juanell Fairly, MD       Or  . acetaminophen (TYLENOL) suppository 650 mg  650 mg Rectal Q6H PRN Juanell Fairly, MD      . albuterol (PROVENTIL) (2.5 MG/3ML) 0.083% nebulizer solution 2.5 mg  2.5 mg Nebulization Q6H PRN Juanell Fairly, MD      . alum & mag hydroxide-simeth (MAALOX/MYLANTA) 200-200-20 MG/5ML suspension 30 mL  30 mL Oral Q4H PRN Juanell Fairly, MD      . atorvastatin (LIPITOR) tablet 10 mg  10 mg Oral QPM Juanell Fairly, MD      . bisacodyl (DULCOLAX) EC tablet 5 mg  5 mg Oral Daily PRN Juanell Fairly, MD      . ceFAZolin (ANCEF) IVPB 2 g/50 mL premix  2 g Intravenous Q6H Juanell Fairly, MD      . celecoxib (CELEBREX) capsule 200 mg  200 mg Oral Q12H Juanell Fairly, MD      . cholecalciferol (VITAMIN D) tablet 2,000 Units  2,000 Units Oral Daily Juanell Fairly, MD      . clobetasol ointment (TEMOVATE) 0.05 % 1 application  1 application Topical BID PRN Juanell Fairly, MD      .  diphenhydrAMINE (BENADRYL) 12.5 MG/5ML elixir 12.5-25 mg  12.5-25 mg Oral Q4H PRN Juanell FairlyKevin Krasinski, MD      . docusate sodium (COLACE) capsule 100 mg  100 mg Oral BID Juanell FairlyKevin Krasinski, MD      . Melene Muller[START ON 11/22/2016] enoxaparin (LOVENOX) injection 30 mg  30 mg Subcutaneous Q12H Juanell FairlyKevin Krasinski, MD      . fentaNYL (SUBLIMAZE) 100 MCG/2ML injection           . fentaNYL (SUBLIMAZE) 100 MCG/2ML injection           . ferrous sulfate tablet 325 mg  325 mg Oral TID PC Juanell FairlyKevin Krasinski, MD      . gabapentin (NEURONTIN) capsule 300 mg  300 mg Oral TID Juanell FairlyKevin Krasinski, MD      . glipiZIDE (GLUCOTROL XL) 24 hr tablet 5 mg  5 mg Oral Daily Juanell FairlyKevin Krasinski, MD      . HYDROmorphone (DILAUDID) 1 MG/ML injection           . linagliptin (TRADJENTA) tablet  5 mg  5 mg Oral Daily Juanell FairlyKevin Krasinski, MD      . magnesium citrate solution 1 Bottle  1 Bottle Oral Once PRN Juanell FairlyKevin Krasinski, MD      . menthol-cetylpyridinium (CEPACOL) lozenge 3 mg  1 lozenge Oral PRN Juanell FairlyKevin Krasinski, MD       Or  . phenol (CHLORASEPTIC) mouth spray 1 spray  1 spray Mouth/Throat PRN Juanell FairlyKevin Krasinski, MD      . Melene Muller[START ON 11/22/2016] metFORMIN (GLUCOPHAGE-XR) 24 hr tablet 500 mg  500 mg Oral BID WC Juanell FairlyKevin Krasinski, MD      . methocarbamol (ROBAXIN) tablet 500 mg  500 mg Oral Q6H PRN Juanell FairlyKevin Krasinski, MD       Or  . methocarbamol (ROBAXIN) 500 mg in dextrose 5 % 50 mL IVPB  500 mg Intravenous Q6H PRN Juanell FairlyKevin Krasinski, MD      . metoCLOPramide (REGLAN) tablet 5-10 mg  5-10 mg Oral Q8H PRN Juanell FairlyKevin Krasinski, MD       Or  . metoCLOPramide (REGLAN) injection 5-10 mg  5-10 mg Intravenous Q8H PRN Juanell FairlyKevin Krasinski, MD      . mometasone-formoterol [facility](DULERA) 100-5 MCG/ACT inhaler 2 puff  2 puff Inhalation BID Juanell FairlyKevin Krasinski, MD      . morphine 2 MG/ML injection 2 mg  2 mg Intravenous Q2H PRN Juanell FairlyKevin Krasinski, MD      . mupirocin ointment (BACTROBAN) 2 % 1 application  1 application Nasal TID Juanell FairlyKevin Krasinski, MD      . ondansetron [facility](ZOFRAN) 4 MG/2ML injection           . ondansetron (ZOFRAN) tablet 4 mg  4 mg Oral Q6H PRN Juanell FairlyKevin Krasinski, MD       Or  . ondansetron [facility](ZOFRAN) injection 4 mg  4 mg Intravenous Q6H PRN Juanell FairlyKevin Krasinski, MD      . oxyCODONE (Oxy IR/ROXICODONE) immediate release tablet 5-10 mg  5-10 mg Oral Q3H PRN Juanell FairlyKevin Krasinski, MD   5 mg at 11/21/16 1603  . [START ON 11/22/2016] pantoprazole (PROTONIX) EC tablet 40 mg  40 mg Oral Daily Juanell FairlyKevin Krasinski, MD      . polyethylene glycol (MIRALAX / GLYCOLAX) packet 17 g  17 g Oral Daily PRN Juanell FairlyKevin Krasinski, MD      . polyvinyl alcohol (LIQUIFILM TEARS) 1.4 % ophthalmic solution   Both Eyes QID Juanell FairlyKevin Krasinski, MD      . sodium chloride flush 0.9 % injection           .  vancomycin (VANCOCIN) 1-5 GM/200ML-% IVPB           . vitamin B-12  (CYANOCOBALAMIN) tablet 1,000 mcg  1,000 mcg Oral Daily Juanell Fairly, MD      . zolpidem [facility]) tablet 5 mg  5 mg Oral QHS PRN Juanell Fairly, MD         Discharge Medications: Please see discharge summary for a list of discharge medications.  Relevant Imaging Results:  Relevant Lab Results:   Additional Information  (SSN: [id number])  Ralene Bathe, Student-Social Work

## 2016-11-21 NOTE — Op Note (Signed)
DATE OF SURGERY:  11/21/2016 TIME: 11:25 AM  PATIENT NAME:  Janet Robles   AGE: 71 y.o.    PRE-OPERATIVE DIAGNOSIS:  M17.11 Unilateral primary osteoarthritis, right knee  POST-OPERATIVE DIAGNOSIS:  Same  PROCEDURE:  Procedure(s): TOTAL KNEE ARTHROPLASTY  SURGEON:  Juanell FairlyKRASINSKI, Claudia Greenley, MD   ASSISTANT:  Altamese CabalMaurice Jones, PA,   Amy Wilhemina CashFinan [facility]([employer] student)  OPERATIVE IMPLANTS: Depuy PFC Sigma, Posterior Stabilized Femural component size 3, Tibia size rotating platform component size 3, Patella polyethylene 3-peg oval button size 35, with a 10 mm tibial polyethylene insert.   PREOPERATIVE INDICATIONS:  Janet Robles is an [age] y.o. female who has a diagnosis of advanced osteoarthritis of the right knee and elected for a right total knee arthroplasty after failing nonoperative treatment, including physical therapy and corticosteroid injection, who has significant impairment of their activities of daily living.  Radiographs have demonstrated tricompartmental osteoarthritis joint space narrowing, osteophytes, subchondral sclerosis and cyst formation.  The risks, benefits, and alternatives were discussed at length including but not limited to the risks of infection, bleeding, nerve or blood vessel injury, knee stiffness, fracture, dislocation, loosening or failure of the hardware and the need for further surgery. Medical risks include but not limited to DVT and pulmonary embolism, myocardial infarction, stroke, pneumonia, respiratory failure and death. I discussed these risks with the patient in my office prior to the date of surgery. They understood these risks and were willing to proceed.  OPERATIVE FINDINGS AND UNIQUE ASPECTS OF THE CASE:  Patient with advanced degenerative joint disease most notable in the patellofemoral joint and medial compartment.  OPERATIVE DESCRIPTION:  The patient was brought to the operative room and placed in a supine position after undergoing general anesthesia and the  patient had a history of severe spinal headaches after C-sections.  A Foley catheter was placed.  IV antibiotics were given. 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 The lower extremity was prepped and draped in the usual sterile fashion.  A time out was performed to verify the patient's name, date of birth, medical record number, correct site of surgery and correct procedure to be performed. The timeout was also used to confirm the patient received antibiotics and that appropriate instruments, implants and radiographs studies were available in the room.  The leg was elevated and exsanguinated with an Esmarch and the tourniquet was inflated to 275 mmHg for 123 minutes..  A midline incision was made over the right knee. Full-thickness skin flaps were developed. A medial parapatellar arthrotomy was then made and the patella everted and the knee was brought into 90 of flexion. Hoffa's fat pad along with the cruciate ligaments and medial and lateral menisci were resected.   The distal femoral intramedullary canal was opened with a drill and the intramedullary distal femoral cutting jig was inserted into the femoral canal pinned into position. It was set at 5 degrees resecting 10 mm off the distal femur.  Care was taken to protect the collateral ligaments during distal femoral resection.  The distal femoral resection was performed with an oscillating saw. The femoral cutting guide was then removed.  The extramedullary tibial cutting guide was then placed using the anterior tibial crest and second ray of the foot as a references.  The tibial cutting guide was adjusted to allow for appropriate posterior slope.  The tibial cutting block was pinned into position. The slotted stylus was used to measure the proximal tibial resection of 10  mm off the high lateral side.  The tibial long rod alignment guide was then used to confirm position of  the cutting block. A third cross pin through the tibial cutting block was then drilled into position to allow for rotational stability. Care was taken during the tibial resection to protect the medial and collateral ligaments.  The resected tibial bone was removed along with the posterior horns of the menisci.  The PCL was sacrificed.  Extension gap was measured with a spacer block and alignment and extension was confirmed using a long alignment rod.  The attention was then turned back to the femur. The posterior referencing distal femoral sizing guide was applied to the distal femur.  The femur was sized to be a 3. Rotation of the referencing guide was checked with the epicondylar axis and Whitesides line. Then the 4-in-1 cutting jig was then applied to the distal femur. A stylus was used to confirm that the anterior femur would not be notched.   Then the anterior, posterior and chamfer femoral cuts were then made with an oscillating saw.  All posterior osteophytes were removed.  The flexion gap was then measured with a flexion spacer block and long alignment rod and was found to be symmetric with the extension gap and perpendicular to mechanical axis of the tibia.  The distal femoral preparation was completed by performing the posterior stabilized box cut using the cutting block. The entry site for the intramedullary femoral guide was filled with autologous bone graft from bone previously resected earlier in the case.  The proximal tibia plateau was then sized with trial trays. The best coverage was achieved with a size 3. This tibial tray was then pinned into position. The proximal tibia was then prepared with the reamer and keel punch.  After tibial preparation was completed, all trial components were inserted with polyethylene trials.  The knee was found to have excellent balance and full motion with a size 10 mm tibial polyethylene insert..    The attention was then turned to preparation of the  patella. The thickness of the patella was measured with a caliper, the diameter measured with the patella templates.  The patella resection was then made with an oscillating saw using the patella cutting guide.  The final patella size was 35 mm.  3 peg holes for the patella component were then drilled. The trial patella was then placed. Knee was taken through a full range of motion and deemed to be stable with the trial components. All trial components were then removed. The knee capsule was then injected with Exparel and a second injection of a mixture of Marcaine, morphine and Toradol. The joint was copiously irrigated with pulse lavage.  The final total knee arthroplasty components were then cemented into place with a 10 mm trial polyethylene insert and all excess methylmethacrylate was removed.  The joint was again copiously irrigated. After the cement had hardened the knee was again taken through a full range of motion. It was felt to be most stable with the 10 mm tibial polyethylene insert. The actual tibial polyethylene insert was then placed.   The knee was taken through a range of motion and the patella tracked well and the knee was again irrigated copiously.    An Autovac drain was placed. The 2 drain limbs were brought out through the superior lateral knee. The medial arthrotomy was closed with #1 Ethibond. The subcutaneous tissue closed with 0 and 2-0 vicryl, and skin approximated with staples.  A dry sterile and compressive dressing was applied.  A Polar Care was applied to the operative knee along with TENS unit pads and a knee immobilizer.  The patient was awakened and brought to the PACU in stable and satisfactory condition.  All sharp, lap and instrument counts were correct at the conclusion the case. I spoke with the patient's husband in the postop consultation room to let him know the case had been performed without complication and the patient was stable in recovery room.   Total  tourniquet time was 123 minutes.

## 2016-11-21 NOTE — Progress Notes (Signed)
Pt dangled on side of bed. She started to cry because of pain. Knee immobilizer in place.

## 2016-11-21 NOTE — Anesthesia Post-op Follow-up Note (Cosign Needed)
Anesthesia QCDR form completed.        

## 2016-11-21 NOTE — Progress Notes (Signed)
  Subjective:  POST-OP CHECK:  Status post right total knee arthroplasty. Patient reports right knee pain as mild to moderate.  Patient explains that she had episodes of vomiting this afternoon. Her pain is currently controlled on oxycodone. Her husband is at the bedside.  Objective:   VITALS:   Vitals:   11/21/16 1522 11/21/16 1633 11/21/16 1707 11/21/16 1735  BP: (!) 134/57 (!) 141/54 137/62 140/69  Pulse: 82 86 82 91  Resp: 16  18   Temp: 97.6 F (36.4 C) 97.8 F (36.6 C) 98.1 F (36.7 C) 97.8 F (36.6 C)  TempSrc: Oral Oral Oral Oral  SpO2: 92% 95% 99% 96%  Weight:      Height:        PHYSICAL EXAM:  Right lower extremity:  Patient has intact sensation to light touch throughout the right lower extremity. She has palpable pedal pulses. She can dorsiflex and plantarflex her ankle and flex and extend her toes. Patient's dressing remains clean and dry. A Hemovac drain is in place. Her knee immobilizer is in place. She has foot pumps and towel rolls on her ankles.   LABS  Results for orders placed or performed during the hospital encounter of 11/21/16 (from the past 24 hour(s))  Glucose, capillary     Status: Abnormal   Collection Time: 11/21/16  6:19 AM  Result Value Ref Range   Glucose-Capillary 141 (H) 65 - 99 mg/dL  ABO/Rh     Status: None   Collection Time: 11/21/16  6:20 AM  Result Value Ref Range   ABO/RH(D) A POS   Glucose, capillary     Status: Abnormal   Collection Time: 11/21/16 11:20 AM  Result Value Ref Range   Glucose-Capillary 178 (H) 65 - 99 mg/dL  Glucose, capillary     Status: Abnormal   Collection Time: 11/21/16  4:31 PM  Result Value Ref Range   Glucose-Capillary 192 (H) 65 - 99 mg/dL    Dg Knee Right Port  Result Date: 11/21/2016 CLINICAL DATA:  Status post right total knee arthroplasty. EXAM: PORTABLE RIGHT KNEE - 1-2 VIEW COMPARISON:  None. FINDINGS: Sequelae of recent total knee arthroplasty are identified. The prosthetic components appear  normally located. No acute fracture is identified. Surgical drains and skin staples are in place. Postoperative gas is present in the soft tissues about the knee. IMPRESSION: Recent right total knee arthroplasty without evidence of acute complication. Electronically Signed   By: Sebastian AcheAllen  Grady M.D.   On: 11/21/2016 11:54    Assessment/Plan: Day of Surgery   Active Problems:   S/P total knee arthroplasty, right  Patient is doing well postop. Her vomiting has subsided. Patient's pain is currently controlled. Continue oxycodone for pain. Foley catheter will be removed in the a.m. Labs will be checked tomorrow morning. Physical therapy will begin tomorrow.  Patient will be instructed on use of incentive spirometry by respiratory therapy tonight. She'll complete 24 hours of postop antibiotics. Lovenox will be started tomorrow for DVT prophylaxis.    Juanell FairlyKRASINSKI, Haider Hornaday , MD 11/21/2016, 7:16 PM

## 2016-11-22 ENCOUNTER — Encounter: Payer: Self-pay | Admitting: Orthopedic Surgery

## 2016-11-22 LAB — CBC
HCT: 30.7 % — ABNORMAL LOW (ref 35.0–47.0)
HEMOGLOBIN: 10.4 g/dL — AB (ref 12.0–16.0)
MCH: 29.8 pg (ref 26.0–34.0)
MCHC: 34 g/dL (ref 32.0–36.0)
MCV: 87.6 fL (ref 80.0–100.0)
Platelets: 209 10*3/uL (ref 150–440)
RBC: 3.51 MIL/uL — AB (ref 3.80–5.20)
RDW: 14.1 % (ref 11.5–14.5)
WBC: 11.9 10*3/uL — ABNORMAL HIGH (ref 3.6–11.0)

## 2016-11-22 LAB — BASIC METABOLIC PANEL
Anion gap: 6 (ref 5–15)
BUN: 11 mg/dL (ref 6–20)
CHLORIDE: 103 mmol/L (ref 101–111)
CO2: 25 mmol/L (ref 22–32)
Calcium: 8.3 mg/dL — ABNORMAL LOW (ref 8.9–10.3)
Creatinine, Ser: 0.72 mg/dL (ref 0.44–1.00)
GFR calc Af Amer: >60 mL/min (ref >60)
GFR calc non Af Amer: >60 mL/min (ref >60)
GLUCOSE: 113 mg/dL — AB (ref 65–99)
Potassium: 3.6 mmol/L (ref 3.5–5.1)
Sodium: 134 mmol/L — ABNORMAL LOW (ref 135–145)

## 2016-11-22 LAB — GLUCOSE, CAPILLARY
Glucose-Capillary: 183 mg/dL — ABNORMAL HIGH (ref 65–99)
Glucose-Capillary: 118 mg/dL — ABNORMAL HIGH (ref 65–99)

## 2016-11-22 MED ORDER — OXYCODONE HCL 5 MG PO TABS
10.0000 mg | ORAL_TABLET | ORAL | Status: DC | PRN
  Administered 2016-11-22 – 2016-11-24 (×6): 10 mg via ORAL
  Filled 2016-11-22 (×6): qty 2

## 2016-11-22 NOTE — Progress Notes (Signed)
Subjective:  Postoperative day #1 status post right total knee arthroplasty. Patient reports right knee pain as moderate.  Patient was able to get out of bed and walked in the hallway and has been up out of bed to a chair as well. She complains of increased pain in the right knee today.  Objective:   VITALS:   Vitals:   11/21/16 2352 11/22/16 0541 11/22/16 0803 11/22/16 1123  BP: (!) 160/63 (!) 125/51 (!) 119/50 (!) 132/52  Pulse: (!) 102 98 97 93  Resp: 19 19 18 16   Temp: 98.7 F (37.1 C) 99.1 F (37.3 C) 98 F (36.7 C) 98.5 F (36.9 C)  TempSrc: Oral Oral Oral Oral  SpO2: 94% 95% 91% 94%  Weight:      Height:        PHYSICAL EXAM:  Right lower extremity: Patient's dressing remained clean dry and intact. She has intact sensation to touch in palpable pedal pulses. She dorsiflex and plantarflex her ankle and flex and extend her toes. She has no calf tenderness or lower leg compartments are soft and compressible.   LABS  Results for orders placed or performed during the hospital encounter of 11/21/16 (from the past 24 hour(s))  Glucose, capillary     Status: Abnormal   Collection Time: 11/21/16  4:31 PM  Result Value Ref Range   Glucose-Capillary 192 (H) 65 - 99 mg/dL  CBC     Status: Abnormal   Collection Time: 11/22/16  4:34 AM  Result Value Ref Range   WBC 11.9 (H) 3.6 - 11.0 K/uL   RBC 3.51 (L) 3.80 - 5.20 MIL/uL   Hemoglobin 10.4 (L) 12.0 - 16.0 g/dL   HCT 16.1 (L) 09.6 - 04.5 %   MCV 87.6 80.0 - 100.0 fL   MCH 29.8 26.0 - 34.0 pg   MCHC 34.0 32.0 - 36.0 g/dL   RDW 40.9 81.1 - 91.4 %   Platelets 209 150 - 440 K/uL  Basic metabolic panel     Status: Abnormal   Collection Time: 11/22/16  4:34 AM  Result Value Ref Range   Sodium 134 (L) 135 - 145 mmol/L   Potassium 3.6 3.5 - 5.1 mmol/L   Chloride 103 101 - 111 mmol/L   CO2 25 22 - 32 mmol/L   Glucose, Bld 113 (H) 65 - 99 mg/dL   BUN 11 6 - 20 mg/dL   Creatinine, Ser 7.82 0.44 - 1.00 mg/dL   Calcium 8.3 (L)  8.9 - 10.3 mg/dL   GFR calc non Af Amer >60 >60 mL/min   GFR calc Af Amer >60 >60 mL/min   Anion gap 6 5 - 15    Dg Knee Right Port  Result Date: 11/21/2016 CLINICAL DATA:  Status post right total knee arthroplasty. EXAM: PORTABLE RIGHT KNEE - 1-2 VIEW COMPARISON:  None. FINDINGS: Sequelae of recent total knee arthroplasty are identified. The prosthetic components appear normally located. No acute fracture is identified. Surgical drains and skin staples are in place. Postoperative gas is present in the soft tissues about the knee. IMPRESSION: Recent right total knee arthroplasty without evidence of acute complication. Electronically Signed   By: Sebastian Ache M.D.   On: 11/21/2016 11:54    Assessment/Plan: 1 Day Post-Op   Active Problems:   S/P total knee arthroplasty, right  Patient doing well postop. Going to increase her oxycodone to help with her pain. She'll continue 24 hours postop antibiotics. Foley catheter has been removed. She start Lovenox today for  DVT prophylaxis. Her hemoglobin and hematocrit remained stable postop. Continue incentive spirometry and physical therapy.    Juanell FairlyKRASINSKI, Avyn Coate , MD 11/22/2016, 1:44 PM

## 2016-11-22 NOTE — Progress Notes (Signed)
Physical Therapy Treatment Patient Details Name: Janet SmilesMary Robles MRN: [id number] DOB: [birth year] Today's Date: 11/22/2016    History of Present Illness Pt admitted for R TKR. Pt now has intact sensation and agreeable to therapy.    PT Comments    Pt is making gradual progress towards goals. KI donned for all mobility as she is unable to fully SLRs without assistance. Good progress with there-ex with less assistance required. Pt able to increase ambulation distance this date, however complains of dizziness with ambulation towards hallway, requesting to return back to room. Pt still anxious about progress and mobility, reassurance given. Will continue to progress.  Follow Up Recommendations  Home health PT     Equipment Recommendations  Rolling walker with 5" wheels    Recommendations for Other Services       Precautions / Restrictions Precautions Precautions: Fall Restrictions Weight Bearing Restrictions: Yes RLE Weight Bearing: Weight bearing as tolerated    Mobility  Bed Mobility Overal bed mobility: Needs Assistance Bed Mobility: Supine to Sit     Supine to sit: Min assist     General bed mobility comments: assist for sliding R LE off bed and scooting forward to EOB. Safe technique performed with upright posture noted.  Transfers Overall transfer level: Needs assistance Equipment used: Rolling walker (2 wheeled) Transfers: Sit to/from Stand Sit to Stand: Min assist;From elevated surface         General transfer comment: Improved ease of transfer from elevated surface. Safe technique performed with ability to push from seated surface. UPright posture noted.  Ambulation/Gait Ambulation/Gait assistance: Min guard Ambulation Distance (Feet): 40 Feet Assistive device: Rolling walker (2 wheeled) Gait Pattern/deviations: Step-to pattern     General Gait Details: ambulated in room with symptoms noted of dizziness, deferred further distance at this time. Step to gait  pattern noted with cues for more fluid technique. Pt very anxious with all movement and painful with WB through R LE; needing heavy assistance through B UEs.   Stairs            Wheelchair Mobility    Modified Rankin (Stroke Patients Only)       Balance Overall balance assessment: Needs assistance Sitting-balance support: Feet supported Sitting balance-Leahy Scale: Good     Standing balance support: Bilateral upper extremity supported Standing balance-Leahy Scale: Fair                      Cognition Arousal/Alertness: Awake/alert Behavior During Therapy: WFL for tasks assessed/performed Overall Cognitive Status: Within Functional Limits for tasks assessed                      Exercises Total Joint Exercises Goniometric ROM: R knee AAROM: 16-70 degrees  Other Exercises Other Exercises: Supine ther-ex performed on R LE including ankle pumps, quad sets, SAQ, hip abd/add, and SLRs. 10 reps performed with cga and cues for assistance    General Comments        Pertinent Vitals/Pain Pain Assessment: 0-10 Pain Score: 9  Pain Location: R knee Pain Descriptors / Indicators: Operative site guarding Pain Intervention(s): Limited activity within patient's tolerance;Premedicated before session;Ice applied (per pt, the medicine isn't helping)    Home Living Family/patient expects to be discharged to:: Private residence Living Arrangements: Spouse/significant other Available Help at Discharge: Family Type of Home: House Home Access: Stairs to enter Entrance Stairs-Rails: Can reach both Home Layout: One level        Prior Function Level of  Independence: Independent      Comments: was going to therapy to strengthen prior surgery.   PT Goals (current goals can now be found in the care plan section) Acute Rehab PT Goals Patient Stated Goal: to get stronger PT Goal Formulation: With patient Time For Goal Achievement: 12/06/16 Potential to Achieve  Goals: Good Additional Goals Additional Goal #1: Pt will be able to perform bed mobility/transfers with supervision using RW in order to improve functional mobility Progress towards PT goals: Progressing toward goals    Frequency    BID      PT Plan Current plan remains appropriate    Co-evaluation             End of Session Equipment Utilized During Treatment: Gait belt Activity Tolerance: Patient limited by pain Patient left: in bed;with bed alarm set;with SCD's reapplied     Time: 4098-1191 PT Time Calculation (min) (ACUTE ONLY): 30 min  Charges:  $Gait Training: 8-22 mins $Therapeutic Exercise: 8-22 mins                    G Codes:      Janet Robles 2016-11-27, 3:20 PM  Elizabeth Palau, PT, DPT [phone]

## 2016-11-22 NOTE — Progress Notes (Deleted)
Received a call from lab informing that pt came back positive for MRSA. Pt has history of MRSA last year. Appropriate precautions (contact) initiated. Pt education given.

## 2016-11-22 NOTE — Progress Notes (Signed)
Pt was up all night. She is very anxious and questions everything the nurse is trying to do. She does not want to take celebrex because it makes her jittery. She said the doctor knows this. Encouraged to use incentive spirometry. Pt does not think the polar care is working. She wanted the dsg taken off to make sure it was ok. She was told we could not take the dsg off. Foley dc'd this am .

## 2016-11-22 NOTE — Progress Notes (Signed)
Clinical Social Worker (CSW) received SNF consult. PT is recommending home health. RN case manager aware of above. Please reconsult if future social work needs arise. CSW signing off.   Gwenlyn Hottinger, LCSW [phone] 

## 2016-11-22 NOTE — Evaluation (Signed)
Physical Therapy Evaluation Patient Details Name: Janet Robles MRN: [id number] DOB: 03/22/1946 Today's Date: 11/22/2016   History of Present Illness  Pt admitted for R TKR. Pt now has intact sensation and agreeable to therapy.  Clinical Impression  Pt is a pleasant 71 year old female who was admitted for R TKR. Pt performs bed mobility with min assist, transfers with min assist +2 using RW, and ambulation with RW and cga. Pt demonstrates deficits with strength/endurance/mobility. Pt unable to perform SLRs without assistance at this time. Needs KI for all mobility until able to safely perform SLRs. Pt very anxious with all mobility and asks multiple questions prior to initiation of mobility. Would benefit from skilled PT to address above deficits and promote optimal return to PLOF. Recommend transition to HHPT upon discharge from acute hospitalization.       Follow Up Recommendations Home health PT    Equipment Recommendations  Rolling walker with 5" wheels    Recommendations for Other Services       Precautions / Restrictions Precautions Precautions: Fall Restrictions Weight Bearing Restrictions: Yes RLE Weight Bearing: Weight bearing as tolerated      Mobility  Bed Mobility Overal bed mobility: Needs Assistance Bed Mobility: Supine to Sit     Supine to sit: Min assist     General bed mobility comments: assist for bed mobility and sliding R LE off bed. KI applied prior to mobility performed.   Transfers Overall transfer level: Needs assistance Equipment used: Rolling walker (2 wheeled) Transfers: Sit to/from Stand Sit to Stand: Min assist;+2 physical assistance         General transfer comment: first attempt for transfer with difficulty raising from seated surface. +2 assistance required with improved ease. RW used for safety  Ambulation/Gait Ambulation/Gait assistance: Min guard Ambulation Distance (Feet): 3 Feet Assistive device: Rolling walker (2  wheeled) Gait Pattern/deviations: Step-to pattern     General Gait Details: ambulation performed with RW and +2 for safety. Pt required cues for sequencing and correct use of AD. Pt very anxious with all mobility  Stairs            Wheelchair Mobility    Modified Rankin (Stroke Patients Only)       Balance Overall balance assessment: Needs assistance Sitting-balance support: Feet supported Sitting balance-Leahy Scale: Good     Standing balance support: Bilateral upper extremity supported Standing balance-Leahy Scale: Fair                               Pertinent Vitals/Pain Pain Assessment: 0-10 Pain Score: 10-Worst pain ever Pain Location: R knee Pain Descriptors / Indicators: Operative site guarding Pain Intervention(s): Limited activity within patient's tolerance;Repositioned;Ice applied;Patient requesting pain meds-RN notified    Home Living Family/patient expects to be discharged to:: Private residence Living Arrangements: Spouse/significant other Available Help at Discharge: Family Type of Home: House Home Access: Stairs to enter Entrance Stairs-Rails: Can reach both Entrance Stairs-Number of Steps: 5 Home Layout: One level        Prior Function Level of Independence: Independent         Comments: was going to therapy to strengthen prior surgery.     Hand Dominance        Extremity/Trunk Assessment   Upper Extremity Assessment Upper Extremity Assessment: Overall WFL for tasks assessed    Lower Extremity Assessment Lower Extremity Assessment: Generalized weakness (L LE grossly 4+/5; R LE grossly 2/5, unable to perform  SLR)       Communication   Communication: No difficulties  Cognition Arousal/Alertness: Awake/alert Behavior During Therapy: WFL for tasks assessed/performed Overall Cognitive Status: Within Functional Limits for tasks assessed                      General Comments      Exercises Total Joint  Exercises Goniometric ROM: R knee AAROM: 16-70 degrees  Other Exercises Other Exercises: Supine ther-ex performed on R LE including ankle pumps, quad sets, SLRs, and seated knee flexion stretches. 10 reps performed with cga and cues for assistance   Assessment/Plan    PT Assessment Patient needs continued PT services  PT Problem List Decreased strength;Decreased range of motion;Decreased activity tolerance;Decreased balance;Decreased mobility;Pain;Decreased safety awareness          PT Treatment Interventions DME instruction;Gait training;Therapeutic exercise;Stair training    PT Goals (Current goals can be found in the Care Plan section)  Acute Rehab PT Goals Patient Stated Goal: to get stronger PT Goal Formulation: With patient Time For Goal Achievement: 12/06/16 Potential to Achieve Goals: Good    Frequency BID   Barriers to discharge        Co-evaluation               End of Session Equipment Utilized During Treatment: Gait belt Activity Tolerance: Patient limited by pain Patient left: in chair;with chair alarm set;with SCD's reapplied Nurse Communication: Mobility status         Time: [id number] PT Time Calculation (min) (ACUTE ONLY): 37 min   Charges:   PT Evaluation $PT Eval Moderate Complexity: 1 Procedure PT Treatments $Therapeutic Exercise: 8-22 mins   PT G Codes:        Guerino Caporale 11/22/2016, 12:40 PM  Elizabeth PalauStephanie Timiko Offutt, PT, DPT [phone]

## 2016-11-23 LAB — CBC
HEMATOCRIT: 29.5 % — AB (ref 35.0–47.0)
HEMOGLOBIN: 10.3 g/dL — AB (ref 12.0–16.0)
MCH: 30.2 pg (ref 26.0–34.0)
MCHC: 35.1 g/dL (ref 32.0–36.0)
MCV: 86 fL (ref 80.0–100.0)
Platelets: 207 10*3/uL (ref 150–440)
RBC: 3.43 MIL/uL — ABNORMAL LOW (ref 3.80–5.20)
RDW: 13.9 % (ref 11.5–14.5)
WBC: 11.3 10*3/uL — ABNORMAL HIGH (ref 3.6–11.0)

## 2016-11-23 LAB — GLUCOSE, CAPILLARY
GLUCOSE-CAPILLARY: 124 mg/dL — AB (ref 65–99)
Glucose-Capillary: 151 mg/dL — ABNORMAL HIGH (ref 65–99)
Glucose-Capillary: 167 mg/dL — ABNORMAL HIGH (ref 65–99)
Glucose-Capillary: 140 mg/dL — ABNORMAL HIGH (ref 65–99)

## 2016-11-23 NOTE — Progress Notes (Signed)
  Subjective:  Patient is up out of bed to a chair. Patient reports right knee pain as mild to moderate.  Patient's husband and daughter at the bedside.  Objective:   VITALS:   Vitals:   11/22/16 1453 11/22/16 1952 11/23/16 0350 11/23/16 0821  BP: (!) 142/55 131/62 (!) 130/48 (!) 131/44  Pulse: 97 99 97 99  Resp: 18 18 18 16   Temp: 98.6 F (37 C) 98.4 F (36.9 C) 97.9 F (36.6 C)   TempSrc: Oral Oral Oral   SpO2: 90% 99% 92% 93%  Weight:      Height:        PHYSICAL EXAM:  Right lower; patient is in a knee immobilizer. Her dressing remained clean dry and intact. She has no calf tenderness or lower leg edema. She has palpable pedal pulses, intact sensation light touch and intact motor function. Neurovascular intact Sensation intact distally Intact pulses distally Dorsiflexion/Plantar flexion intact Compartment soft  LABS  Results for orders placed or performed during the hospital encounter of 11/21/16 (from the past 24 hour(s))  Glucose, capillary     Status: Abnormal   Collection Time: 11/22/16  4:35 PM  Result Value Ref Range   Glucose-Capillary 183 (H) 65 - 99 mg/dL  Glucose, capillary     Status: Abnormal   Collection Time: 11/22/16  9:54 PM  Result Value Ref Range   Glucose-Capillary 118 (H) 65 - 99 mg/dL  CBC     Status: Abnormal   Collection Time: 11/23/16  4:13 AM  Result Value Ref Range   WBC 11.3 (H) 3.6 - 11.0 K/uL   RBC 3.43 (L) 3.80 - 5.20 MIL/uL   Hemoglobin 10.3 (L) 12.0 - 16.0 g/dL   HCT 16.129.5 (L) 09.635.0 - 04.547.0 %   MCV 86.0 80.0 - 100.0 fL   MCH 30.2 26.0 - 34.0 pg   MCHC 35.1 32.0 - 36.0 g/dL   RDW 40.913.9 81.111.5 - 91.414.5 %   Platelets 207 150 - 440 K/uL  Glucose, capillary     Status: Abnormal   Collection Time: 11/23/16  8:22 AM  Result Value Ref Range   Glucose-Capillary 151 (H) 65 - 99 mg/dL  Glucose, capillary     Status: Abnormal   Collection Time: 11/23/16 11:18 AM  Result Value Ref Range   Glucose-Capillary 167 (H) 65 - 99 mg/dL    No  results found.  Assessment/Plan: 2 Days Post-Op   Active Problems:   S/P total knee arthroplasty, right  Patient will continue physical therapy. She is making good progress postop. Anticipate discharge tomorrow with home health PT. Patient is on Lovenox for DVT prophylaxis. She'll follow up in the office in 2 weeks following discharge from the hospital.    Juanell FairlyKRASINSKI, Charelle Petrakis , MD 11/23/2016, 1:04 PM

## 2016-11-23 NOTE — Progress Notes (Signed)
Physical Therapy Treatment Patient Details Name: Janet SmilesMary Shatzer MRN: [id number] DOB: 1946/09/21 Today's Date: 11/23/2016    History of Present Illness Pt admitted for R TKR, POD#2. PMHx significant for HTN, asthma, GERD, diabetes    PT Comments    Pt ambulated around RN station again this afternoon with cues for consistently performing reciprocal gait pattern. Pt anxious with mobility and fatigues with increased exertion, however no seated rest breaks required. KI donned for all mobility. Stair training completed with therapist demonstrating technique prior to performance. Pt continues to be motivated to perform therapy, though at times is impulsive and needs cues to take her time. Family in room very supportive. Will continue to progress.  Follow Up Recommendations  Home health PT     Equipment Recommendations  Rolling walker with 5" wheels    Recommendations for Other Services       Precautions / Restrictions Precautions Precautions: Fall;Knee Precaution Booklet Issued: No Restrictions Weight Bearing Restrictions: Yes RLE Weight Bearing: Weight bearing as tolerated    Mobility  Bed Mobility Overal bed mobility: Needs Assistance Bed Mobility: Sit to Supine       Sit to supine: Min guard   General bed mobility comments: safe technique with pt only requiring assist for R LE. Bed flat and pt able to sc[facility] difficulty.  Transfers Overall transfer level: Needs assistance Equipment used: Rolling walker (2 wheeled) Transfers: Sit to/from Stand Sit to Stand: Min guard         General transfer comment: safe technique performed with pushing from seated surface. Once standing, pt able to demonstrate upright posture.  Ambulation/Gait Ambulation/Gait assistance: Min guard Ambulation Distance (Feet): 300 Feet Assistive device: Rolling walker (2 wheeled) Gait Pattern/deviations: Step-through pattern     General Gait Details: ambulated using reciprocal  gait pattern with step to gait pattern. Increased step length noted on R LE with cues for appropriate step. Pt also cued not to "pound down" on L foot. Pt able to improve to reciprocal gait pattern. Still required heavy cues for consistency.   Stairs Stairs: Yes   Stair Management: Two rails Number of Stairs: 4 General stair comments: navigated up/down 4 stairs with B railing. Pt very anxious and tearful, however able to perform with only min assist. +2 available for safety. Therapist demonstrated prior to performance.  Wheelchair Mobility    Modified Rankin (Stroke Patients Only)       Balance                                    Cognition Arousal/Alertness: Awake/alert Behavior During Therapy: WFL for tasks assessed/performed Overall Cognitive Status: History of cognitive impairments - at baseline                      Exercises Other Exercises Other Exercises: supine ther-ex performed on R LE including ankle pumps, quad sets, SAQ, hip abd/add, and SLRs. 12 reps performed with cues for technique. Cga provided with min assist for SLRs. Other Exercises: Pt assisted ambulating to [facility]. Pt slightly impulsive, needs cues for waiting until therapist instruction. Supervision and set up required for hygiene tasks.     General Comments        Pertinent Vitals/Pain Pain Assessment: Faces Faces Pain Scale: Hurts a little bit Pain Location: R knee Pain Descriptors / Indicators: Operative site guarding Pain Intervention(s): Limited activity within patient's tolerance;Ice applied;Repositioned  Home Living                      Prior Function            PT Goals (current goals can now be found in the care plan section) Acute Rehab PT Goals Patient Stated Goal: to get stronger PT Goal Formulation: With patient Time For Goal Achievement: 12/06/16 Potential to Achieve Goals: Good Progress towards PT goals: Progressing toward goals     Frequency    BID      PT Plan Current plan remains appropriate    Co-evaluation             End of Session Equipment Utilized During Treatment: Gait belt Activity Tolerance: Patient limited by pain Patient left: in bed;with bed alarm set;with family/visitor present;with SCD's reapplied     Time: 1255-1349 PT Time Calculation (min) (ACUTE ONLY): 54 min  Charges:  $Gait Training: 23-37 mins $Therapeutic Exercise: 8-22 mins $Therapeutic Activity: 8-22 mins                    G Codes:      Keiasha Diep 12/20/2016, 3:52 PM Elizabeth Palau, PT, DPT [phone]

## 2016-11-23 NOTE — Evaluation (Signed)
Occupational Therapy Evaluation Patient Details Name: Janet Robles MRN: [id number] DOB: Jul 09, 1946 Today's Date: 11/23/2016    History of Present Illness Pt admitted for R TKR, POD#2. PMHx significant for HTN, asthma, GERD, diabetes   Clinical Impression   Pt seen for OT evaluation this date. Pt is a 71yo female s/p R TKR. Pt was independent with ADL/IADL including driving and working part time from home and was seeing PT for strengthening prior to surgery. Pt reported difficulty with chronic disease management of diabetes (sometimes forgetting to take medications, checking blood sugar <1x/day, and difficulty with washing/drying feet). Pt motivated to participate and presents with pain, decreased strength/ROM in RLE, decreased activity tolerance, decreased safety awareness, and decreased knowledge of AE/DME for self care tasks with need for skilled OT services to address noted impairments and functional deficits. Pt would benefit from A/E training for LB ADL while in the hospital to improve functional independence and safety with LB bathing, particularly her feet care. Following this hospitalization, pt would benefit from education/training in energy conservation strategies and home safety assessment and modification recommendations to maximize safety/falls prevention as well as compensatory strategies to support chronic disease management of diabetes to maximize adherence, self-confidence, and self-efficacy of self management in order to minimize risk of rehospitalization.      Follow Up Recommendations  Home health OT    Equipment Recommendations  Tub/shower seat;Toilet rise with handles;Other (comment) (LH shoe horn, LH sponge, reacher)    Recommendations for Other Services       Precautions / Restrictions Precautions Precautions: Fall Restrictions Weight Bearing Restrictions: Yes RLE Weight Bearing: Weight bearing as tolerated      Mobility Bed Mobility Overal bed mobility:  Needs Assistance Bed Mobility: Supine to Sit     Supine to sit: Min assist;HOB elevated     General bed mobility comments: assist for sliding R LE off bed and additional time needed to complete, with HOB elevated  Transfers Overall transfer level: Needs assistance Equipment used: Rolling walker (2 wheeled) Transfers: Sit to/from Stand Sit to Stand: Min guard         General transfer comment: verbal cues for hand placement    Balance Overall balance assessment: Needs assistance Sitting-balance support: Feet supported Sitting balance-Leahy Scale: Good     Standing balance support: Bilateral upper extremity supported;During functional activity Standing balance-Leahy Scale: Fair                              ADL Overall ADL's : Needs assistance/impaired Eating/Feeding: Set up;Bed level   Grooming: Wash/dry hands;Oral care;Bed level;Set up   Upper Body Bathing: Sitting;Minimal assistance;Set up   Lower Body Bathing: With adaptive equipment;Sitting/lateral leans;Sit to/from stand;Cueing for compensatory techniques;Moderate assistance Lower Body Bathing Details (indicate cue type and reason): Pt difficulty with LB bathing at baseline, increased need for assistance due to pain and decreased ROM in RLE  Upper Body Dressing : Set up;Sitting;Supervision/safety   Lower Body Dressing: Sit to/from stand;Sitting/lateral leans;Cueing for compensatory techniques;Moderate assistance   Toilet Transfer: BSC;Cueing for sequencing;Cueing for safety;RW;Min guard Toilet Transfer Details (indicate cue type and reason): pt reports difficulty at baseline with toilet transfers from regular height toilet; pt required min guard and verbal cues for hand placement during toilet transfer to [facility] next to bed Toileting- Clothing Manipulation and Hygiene: Supervision/safety;Sitting/lateral lean       Functional mobility during ADLs: Min guard       Vision Vision Assessment?: No  apparent  visual deficits   Perception     Praxis      Pertinent Vitals/Pain Pain Assessment: Faces Faces Pain Scale: Hurts little more Pain Location: R knee Pain Descriptors / Indicators: Operative site guarding Pain Intervention(s): Limited activity within patient's tolerance;Monitored during session;Repositioned;Ice applied     Hand Dominance Right   Extremity/Trunk Assessment Upper Extremity Assessment Upper Extremity Assessment: Overall WFL for tasks assessed   Lower Extremity Assessment Lower Extremity Assessment: Defer to PT evaluation;Generalized weakness       Communication Communication Communication: No difficulties   Cognition Arousal/Alertness: Awake/alert Behavior During Therapy: WFL for tasks assessed/performed Overall Cognitive Status: History of cognitive impairments - at baseline                 General Comments: Pt reports some mild short term memory loss due to diabetes which impacts her ability to self manage medications, check her blood sugar, and eat meals routinely   General Comments       Exercises   Other Exercises Other Exercises: Pt educated in DME for improving safety at home during ADL, including shower chair, grab bars, toilet risers, and AE like LH sponge to wash feet   Shoulder Instructions      Home Living Family/patient expects to be discharged to:: Private residence Living Arrangements: Spouse/significant other Available Help at Discharge: Family Type of Home: House Home Access: Stairs to enter Secretary/administrator of Steps: 5 Entrance Stairs-Rails: Can reach both Home Layout: One level     Bathroom Shower/Tub: Tub/shower unit Shower/tub characteristics: Engineer, building services: Standard     Home Equipment: Bedside commode          Prior Functioning/Environment Level of Independence: Independent        Comments: Pt reports indep with all ADL, IADL, driving, working part time prior to surgery and receiving PT for  strengthening prior. Pt reports difficulty with caring for feet while bathing, difficulty with toilet transfers from std height toilet, and stated "sometimes I forget to take my meds; I have some short term memory loss but if I get into a routine with something I can do it"        OT Problem List: Pain;Decreased strength;Decreased activity tolerance;Decreased safety awareness;Decreased knowledge of use of DME or AE;Impaired balance (sitting and/or standing);Decreased cognition;Decreased range of motion   OT Treatment/Interventions: Self-care/ADL training;Energy conservation;Patient/family education;DME and/or AE instruction    OT Goals(Current goals can be found in the care plan section) Acute Rehab OT Goals Patient Stated Goal: to get stronger OT Goal Formulation: With patient Time For Goal Achievement: 12/07/16 Potential to Achieve Goals: Good  OT Frequency: Min 1X/week   Barriers to D/C:            Co-evaluation              End of Session Equipment Utilized During Treatment: Rolling walker;Right knee immobilizer  Activity Tolerance: Patient tolerated treatment well Patient left: in bed;with call bell/phone within reach;with SCD's reapplied   Time: 1610-9604 OT Time Calculation (min): 38 min Charges:  OT General Charges $OT Visit: 1 Procedure OT Evaluation $OT Eval Moderate Complexity: 1 Procedure OT Treatments $Self Care/Home Management : 23-37 mins G-Codes:    Eliezer Bottom, OTR/L 11/23/2016, 10:15 AM

## 2016-11-23 NOTE — Progress Notes (Signed)
Physical Therapy Treatment Patient Details Name: Janet Robles MRN: [id number] DOB: [birth year] Today's Date: 11/23/2016    History of Present Illness Pt admitted for R TKR, POD#2. PMHx significant for HTN, asthma, GERD, diabetes    PT Comments    Pt is making good progress towards mobility with increased distance. WC follow for safety as she complained occasionally with dizziness, however no seated rest breaks needed. Able to follow commands for improved gait sequencing. Less anxious with OOB mobility this date. Slightly improved AAROM, will continue to progress. Still requires KI for all mobility as she needs assist for SLRs. Will continue to follow.  Follow Up Recommendations  Home health PT     Equipment Recommendations  Rolling walker with 5" wheels    Recommendations for Other Services       Precautions / Restrictions Precautions Precautions: Fall;Knee Precaution Booklet Issued: No Restrictions Weight Bearing Restrictions: Yes RLE Weight Bearing: Weight bearing as tolerated    Mobility  Bed Mobility Overal bed mobility: Needs Assistance Bed Mobility: Supine to Sit     Supine to sit: Min guard     General bed mobility comments: safe technique performed with assist for sliding B LE off bed. Once seated at EOB, able to sit with upright posture. Improved ease with effort this date.  Transfers Overall transfer level: Needs assistance Equipment used: Rolling walker (2 wheeled) Transfers: Sit to/from Stand Sit to Stand: Min guard         General transfer comment: safe technique with pushing from bed. Once standing upright, no dizziness noted.  Ambulation/Gait Ambulation/Gait assistance: Min guard Ambulation Distance (Feet): 200 Feet Assistive device: Rolling walker (2 wheeled) Gait Pattern/deviations: Step-through pattern     General Gait Details: ambulated around RN station with initial step to gait pattern, improving to reciprocal gait pattern, however  inconsistently. Needs heavy cues to improve fluid gait pattern.    Stairs            Wheelchair Mobility    Modified Rankin (Stroke Patients Only)       Balance Overall balance assessment: Needs assistance Sitting-balance support: Feet supported Sitting balance-Leahy Scale: Good     Standing balance support: Bilateral upper extremity supported;During functional activity Standing balance-Leahy Scale: Fair                      Cognition Arousal/Alertness: Awake/alert Behavior During Therapy: WFL for tasks assessed/performed Overall Cognitive Status: History of cognitive impairments - at baseline                 General Comments: Pt reports some mild short term memory loss due to diabetes which impacts her ability to self manage medications, check her blood sugar, and eat meals routinely    Exercises Total Joint Exercises Goniometric ROM: R knee AAROM 15-79 degrees Other Exercises Other Exercises: supine ther-ex performed on R LE including ankle pumps, quad sets, SAQ, hip abd/add, SLRs and seated knee flexion stretches. 12 reps performed with cues for technique. Cga provided with min assist for SLRs. Other Exercises: Pt assisted ambulating to [facility]. Pt slightly impulsive, needs cues for waiting until therapist instruction. Supervision and set up required for hygiene tasks.     General Comments        Pertinent Vitals/Pain Pain Assessment: 0-10 Pain Score: 6  Faces Pain Scale: Hurts little more Pain Location: R knee Pain Descriptors / Indicators: Operative site guarding Pain Intervention(s): Limited activity within patient's tolerance;Ice applied    Home Living Family/patient  expects to be discharged to:: Private residence Living Arrangements: Spouse/significant other Available Help at Discharge: Family Type of Home: House Home Access: Stairs to enter Entrance Stairs-Rails: Can reach both Home Layout: One level Home Equipment: Bedside commode       Prior Function Level of Independence: Independent      Comments: Pt reports indep with all ADL, IADL, driving, working part time prior to surgery and receiving PT for strengthening prior. Pt reports difficulty with caring for feet while bathing, difficulty with toilet transfers from std height toilet, and stated "sometimes I forget to take my meds; I have some short term memory loss but if I get into a routine with something I can do it"   PT Goals (current goals can now be found in the care plan section) Acute Rehab PT Goals Patient Stated Goal: to get stronger PT Goal Formulation: With patient Time For Goal Achievement: 12/06/16 Potential to Achieve Goals: Good Progress towards PT goals: Progressing toward goals    Frequency    BID      PT Plan Current plan remains appropriate    Co-evaluation             End of Session Equipment Utilized During Treatment: Gait belt Activity Tolerance: Patient limited by pain Patient left: in chair;with chair alarm set     Time: [phone] PT Time Calculation (min) (ACUTE ONLY): 39 min  Charges:  $Gait Training: 8-22 mins $Therapeutic Exercise: 8-22 mins $Therapeutic Activity: 8-22 mins                    G Codes:      Janet Robles 2016/12/11, 11:09 AM Elizabeth Palau, PT, DPT [phone]

## 2016-11-23 NOTE — Progress Notes (Signed)
Spoke with Dr. Martha ClanKrasinski and notified him that patient still has IV cefazolin ordered. Order received to discontinue.

## 2016-11-23 NOTE — Care Management Note (Addendum)
Case Management Note  Patient Details  Name: Janet Robles MRN: [id number] Date of Birth: 1946/09/29  Subjective/Objective:   POD  # 2 right TKA. Met with patient at bedside to discuss discharge plans and home health. Referral to [facility] for HHPT. PCP is Dr. Reginold Agent. Pharmacy- [facility]  [phone]. Called Lovenox 40 mg X 4 weeks, no refills. Ordered a rolling walker from [place] with [facility]. Patient denies issues paying for medications. Patient agreeable to POC.                 Action/Plan: [facility] for HHPT. Lovenox called in. Walker ordered.   Expected Discharge Date:                  Expected Discharge Plan:  [place]  In-House Referral:     Discharge planning Services  CM Consult  Post Acute Care Choice:  Durable Medical Equipment, Home Health Choice offered to:  Patient  DME Arranged:  Walker rolling DME Agency:  [place]:  PT [place]:  [facility] ([facility])  Status of Service:  In process, will continue to follow  If discussed at Long Length of Stay Meetings, dates discussed:    Additional Comments:  Jolly Mango, RN 11/23/2016, 9:29 AM

## 2016-11-24 LAB — CBC
HCT: 28.8 % — ABNORMAL LOW (ref 35.0–47.0)
HEMOGLOBIN: 9.9 g/dL — AB (ref 12.0–16.0)
MCH: 29.7 pg (ref 26.0–34.0)
MCHC: 34.5 g/dL (ref 32.0–36.0)
MCV: 86.1 fL (ref 80.0–100.0)
PLATELETS: 223 10*3/uL (ref 150–440)
RBC: 3.34 MIL/uL — AB (ref 3.80–5.20)
RDW: 13.9 % (ref 11.5–14.5)
WBC: 11.9 10*3/uL — ABNORMAL HIGH (ref 3.6–11.0)

## 2016-11-24 LAB — GLUCOSE, CAPILLARY
GLUCOSE-CAPILLARY: 114 mg/dL — AB (ref 65–99)
Glucose-Capillary: 133 mg/dL — ABNORMAL HIGH (ref 65–99)

## 2016-11-24 MED ORDER — OXYCODONE HCL 5 MG PO TABS: 5.0000 mg | ORAL_TABLET | ORAL | 0 refills | Status: AC | PRN

## 2016-11-24 MED ORDER — BISACODYL 10 MG RE SUPP
10.0000 mg | Freq: Once | RECTAL | Status: AC
  Administered 2016-11-24: 10 mg via RECTAL
  Filled 2016-11-24: qty 1

## 2016-11-24 MED ORDER — ENOXAPARIN SODIUM 30 MG/0.3ML ~~LOC~~ SOLN: 30.0000 mg | Freq: Two times a day (BID) | SUBCUTANEOUS | 0 refills | Status: AC

## 2016-11-24 NOTE — Discharge Summary (Signed)
Physician Discharge Summary  Patient ID: Janet SmilesMary Robles MRN: [id number] DOB/AGE: [birth year] 69 y.o.  Admit date: 11/21/2016 Discharge date: 11/24/2016  Admission Diagnoses:  M17.11 Unilateral primary osteoarthritis, right knee <principal problem not specified>  Discharge Diagnoses:  M17.11 Unilateral primary osteoarthritis, right knee Active Problems:   S/P total knee arthroplasty, right   Past Medical History:  Diagnosis Date  . Arthritis   . Asthma   . Diabetes mellitus without complication (HCC)   . Elevated lipids   . GERD (gastroesophageal reflux disease)   . Hypertension    patient states she is borderline hypertensive and may start meds after surgery  . Muscle spasms of neck   . Seasonal allergies     Surgeries: Procedure(s): TOTAL KNEE ARTHROPLASTY on 11/21/2016   Consultants (if any):   Discharged Condition: Improved  Hospital Course: Janet SmilesMary Robles is an 71 y.o. female who was admitted 11/21/2016 with a diagnosis of  M17.11 Unilateral primary osteoarthritis and went to the operating room on 11/21/2016 and underwent an uncomplicated right total knee arthroplasty.    She was given perioperative antibiotics:  Anti-infectives    Start     Dose/Rate Route Frequency Ordered Stop   11/21/16 1500  ceFAZolin (ANCEF) IVPB 2g/100 mL premix  Status:  Discontinued     2 g 200 mL/hr over 30 Minutes Intravenous Every 6 hours 11/21/16 1358 11/21/16 1418   11/21/16 1500  ceFAZolin (ANCEF) IVPB 2 g/50 mL premix  Status:  Discontinued     2 g 100 mL/hr over 30 Minutes Intravenous Every 6 hours 11/21/16 1418 11/23/16 1741   11/21/16 0825  vancomycin (VANCOCIN) 1-5 GM/200ML-% IVPB    Comments:  Eli HoseBuchanan, Leslie: cabinet override      11/21/16 0825 11/21/16 2029   11/21/16 0603  ceFAZolin (ANCEF) 2-4 GM/100ML-% IVPB    Comments:  Buck MamSanchez, Becky Sue: cabinet override      11/21/16 0603 11/21/16 0823   11/21/16 0603  clindamycin (CLEOCIN) 600 MG/50ML IVPB    Comments:  Buck MamSanchez, Becky  Sue: cabinet override      11/21/16 0603 11/21/16 0830   11/21/16 0500  clindamycin (CLEOCIN) IVPB 600 mg     600 mg 100 mL/hr over 30 Minutes Intravenous  Once 11/21/16 0458 11/21/16 0830   11/21/16 0458  ceFAZolin (ANCEF) IVPB 2g/100 mL premix     2 g 200 mL/hr over 30 Minutes Intravenous On call to O.R. 11/21/16 [id number] 11/21/16 [id number]    .  She was given sequential compression devices, early ambulation, and lovenox for DVT prophylaxis.  She benefited maximally from the hospital stay and there were no complications.  Given the patient's clinical improvement on postop day #3 she was discharged home.  Recent vital signs:  Vitals:   11/24/16 0541 11/24/16 0740  BP: 125/61 (!) 130/53  Pulse: 93 94  Resp: 18   Temp: 98.2 F (36.8 C) 97.9 F (36.6 C)    Recent laboratory studies:  Lab Results  Component Value Date   HGB 9.9 (L) 11/24/2016   HGB 10.3 (L) 11/23/2016   HGB 10.4 (L) 11/22/2016   Lab Results  Component Value Date   WBC 11.9 (H) 11/24/2016   PLT 223 11/24/2016   Lab Results  Component Value Date   INR 0.97 11/15/2016   Lab Results  Component Value Date   NA 134 (L) 11/22/2016   K 3.6 11/22/2016   CL 103 11/22/2016   CO2 25 11/22/2016   BUN 11 11/22/2016   CREATININE 0.72 11/22/2016  GLUCOSE 113 (H) 11/22/2016    Discharge Medications:   Allergies as of 11/24/2016   No Known Allergies     Medication List    TAKE these medications   acetaminophen 500 MG tablet Commonly known as:  TYLENOL Take 500 mg by mouth every 6 (six) hours as needed.   ADVAIR DISKUS 100-50 MCG/DOSE Aepb Generic drug:  Fluticasone-Salmeterol Inhale 1 puff into the lungs every 12 (twelve) hours.   atorvastatin 10 MG tablet Commonly known as:  LIPITOR Take 10 mg by mouth every evening.   clobetasol ointment 0.05 % Commonly known as:  TEMOVATE Apply 1 application topically 2 (two) times daily as needed. For lichen sclerosus   enoxaparin 30 MG/0.3ML injection Commonly  known as:  LOVENOX Inject 0.3 mLs (30 mg total) into the skin every 12 (twelve) hours.   esomeprazole 40 MG capsule Commonly known as:  NEXIUM Take 40 mg by mouth every evening.   fluorouracil 5 % cream Commonly known as:  EFUDEX Apply 1 application topically daily as needed. For dermatitis   glipiZIDE 5 MG 24 hr tablet Commonly known as:  GLUCOTROL XL Take 5 mg by mouth daily.   ibuprofen 200 MG tablet Commonly known as:  ADVIL,MOTRIN Take 400 mg by mouth every 8 (eight) hours as needed (for pain.).   JANUVIA 100 MG tablet Generic drug:  sitaGLIPtin Take 100 mg by mouth every evening.   metFORMIN 500 MG 24 hr tablet Commonly known as:  GLUCOPHAGE-XR Take 500 mg by mouth 2 (two) times daily.   mupirocin nasal ointment 2 % Commonly known as:  BACTROBAN Place 1 application into the nose 3 (three) times daily. Use one-half of tube in each nostril twice daily for five (5) days. After application, press sides of nose together and gently massage.   oxyCODONE 5 MG immediate release tablet Commonly known as:  Oxy IR/ROXICODONE Take 1-2 tablets (5-10 mg total) by mouth every 3 (three) hours as needed for moderate pain or breakthrough pain.   SYSTANE LID WIPES EX Apply 1 application to eye 4 (four) times daily.   SYSTANE OP Place 1 drop into both eyes 4 (four) times daily.   VENTOLIN HFA 108 (90 Base) MCG/ACT inhaler Generic drug:  albuterol Inhale 1-2 puffs into the lungs every 6 (six) hours as needed for shortness of breath or wheezing.   vitamin B-12 1000 MCG tablet Commonly known as:  CYANOCOBALAMIN Take 1,000 mcg by mouth daily.   Vitamin D3 2000 units capsule Take 2,000 Units by mouth daily.            Durable Medical Equipment        Start     Ordered   11/23/16 0940  For home use only DME Walker rolling  Once    Question:  Patient needs a walker to treat with the following condition  Answer:  S/P total knee arthroplasty   11/23/16 1610      Diagnostic  Studies: Dg Knee Right Port  Result Date: 11/21/2016 CLINICAL DATA:  Status post right total knee arthroplasty. EXAM: PORTABLE RIGHT KNEE - 1-2 VIEW COMPARISON:  None. FINDINGS: Sequelae of recent total knee arthroplasty are identified. The prosthetic components appear normally located. No acute fracture is identified. Surgical drains and skin staples are in place. Postoperative gas is present in the soft tissues about the knee. IMPRESSION: Recent right total knee arthroplasty without evidence of acute complication. Electronically Signed   By: Sebastian Ache M.D.   On: 11/21/2016 11:54  Disposition: Final discharge disposition not confirmed  Discharge Instructions    Call MD / Call 911    Complete by:  As directed    If you experience chest pain or shortness of breath, CALL 911 and be transported to the hospital emergency room.  If you develope a fever above 101 F, pus (white drainage) or increased drainage or redness at the wound, or calf pain, call your surgeon's office.   Constipation Prevention    Complete by:  As directed    Drink plenty of fluids.  Prune juice may be helpful.  You may use a stool softener, such as Colace (over the counter) 100 mg twice a day.  Use MiraLax (over the counter) for constipation as needed.   Diet general    Complete by:  As directed    Discharge instructions    Complete by:  As directed    Continue strict elevation of the right lower extremity. Patient may use Polar Care for comfort. Patient should use her knee immobilizer when standing or ambulating. She should use her walker for systems with ambulation. Patient may use TED stockings to reduce lower leg swelling until her first office visit. She may weight-bear as tolerated on the right lower extremity. She is going to receive home health physical therapy.   Driving restrictions    Complete by:  As directed    No driving for 8 weeks   Increase activity slowly as tolerated    Complete by:  As directed     Lifting restrictions    Complete by:  As directed    No lifting for 12-16 weeks         Signed: Juanell Fairly ,MD 11/24/2016, 1:54 PM

## 2016-11-24 NOTE — Care Management Note (Signed)
Case Management Note  Patient Details  Name: Janet Robles MRN: [id number] Date of Birth: 07/27/1946  Subjective/Objective:               [facility] notified of discharge  Action/Plan:   Expected Discharge Date:  11/24/16               Expected Discharge Plan:  Home w Home Health Services  In-House Referral:     Discharge planning Services  CM Consult  Post Acute Care Choice:  Durable Medical Equipment, Home Health Choice offered to:  Patient  DME Arranged:  Walker rolling DME Agency:  [facility]  HH Arranged:  PT [facility]  Status of Service:  Completed, signed off  If discussed at [employer] Length of Stay Meetings, dates discussed:    Additional Comments:  Marily MemosLisa M Stephen Baruch, RN 11/24/2016, 2:59 PM

## 2016-11-24 NOTE — Progress Notes (Signed)
Physical Therapy Treatment Patient Details Name: Janet Robles MRN: [id number] DOB: 10/07/1946 Today's Date: 11/24/2016    History of Present Illness Pt admitted for R TKR, POD#2. PMHx significant for HTN, asthma, GERD, diabetes    PT Comments    Pt is making good progress towards goals. Pt able to demonstrate safe technique with all OOB with KI donned. Therex packet given and reviewed. All questions answered. Pt very motivated to perform therapy. Pt ready for dc. Still needs to work on ROM.  Follow Up Recommendations  Home health PT     Equipment Recommendations  Rolling walker with 5" wheels    Recommendations for Other Services       Precautions / Restrictions Precautions Precautions: Fall;Knee Precaution Booklet Issued: Yes (comment) Restrictions Weight Bearing Restrictions: Yes RLE Weight Bearing: Weight bearing as tolerated    Mobility  Bed Mobility               General bed mobility comments: not performed as pt received standing with NA getting up to use bathroom.  Transfers Overall transfer level: Needs assistance Equipment used: Rolling walker (2 wheeled) Transfers: Sit to/from Stand Sit to Stand: Min guard         General transfer comment: safe technique performed with pushing from seated surface. Once standing, pt able to demonstrate upright posture.  Ambulation/Gait Ambulation/Gait assistance: Min guard Ambulation Distance (Feet): 100 Feet Assistive device: Rolling walker (2 wheeled) Gait Pattern/deviations: Step-through pattern     General Gait Details: ambulated in hallway with step to gait pattern able to progress to reciprocal gait pattern. Pt still heavy cues for initiation and sequencing and is easily distracted. Pt still with increased force through L foot when stepping, cues for fluid pattern and symmetrical step length. Pt reports slight dizziness with ambulation, limiting distance this date   Stairs            Wheelchair  Mobility    Modified Rankin (Stroke Patients Only)       Balance                                    Cognition Arousal/Alertness: Awake/alert Behavior During Therapy: WFL for tasks assessed/performed Overall Cognitive Status: History of cognitive impairments - at baseline                      Exercises Total Joint Exercises Goniometric ROM: R knee AAROM: 14-82 degrees Other Exercises Other Exercises: Seated ther-ex performed including R LE ankle pumps, quad sets, SLRs, hip abd/add, LAQ, SAQ, and seated knee flexion stretches. All ther-ex performed x 15 reps with min assist. Written HEP given and reviewed with patient including frequency and duration. Other Exercises: Pt assisted ambulating to [facility]. Pt slightly impulsive, needs cues for waiting until therapist instruction. Supervision and set up required for hygiene tasks.     General Comments        Pertinent Vitals/Pain Pain Assessment: Faces Faces Pain Scale: Hurts a little bit Pain Location: R knee Pain Descriptors / Indicators: Operative site guarding Pain Intervention(s): Limited activity within patient's tolerance;Repositioned;Ice applied    Home Living                      Prior Function            PT Goals (current goals can now be found in the care plan section) Acute Rehab PT Goals  Patient Stated Goal: to get stronger PT Goal Formulation: With patient Time For Goal Achievement: 12/06/16 Potential to Achieve Goals: Good Progress towards PT goals: Progressing toward goals    Frequency    BID      PT Plan Current plan remains appropriate    Co-evaluation             End of Session Equipment Utilized During Treatment: Gait belt Activity Tolerance: Patient limited by pain Patient left: in chair;with chair alarm set     Time: 0916-1006 PT Time Calculation (min) (ACUTE ONLY): 50 min  Charges:  $Gait Training: 23-37 mins $Therapeutic Exercise: 8-22 mins                     G Codes:      Tzipporah Nagorski 11/24/2016, 8:06 PM  Elizabeth PalauStephanie Da Authement, PT, DPT [phone]

## 2016-11-24 NOTE — Progress Notes (Signed)
Patient discharge and RX instructions given and patient acknowledged understanding. Rx for oxycodone given to patient. IV's removed. Granddaughter packed belongings.

## 2016-11-24 NOTE — Progress Notes (Signed)
  Subjective:  Postop day #3 status post right total knee arthroplasty.   Patient reports pain as mild.  Patient has had a bowel movement. She has made good progress with physical therapy and is ready for discharge home.  Objective:   VITALS:   Vitals:   11/23/16 0821 11/23/16 1931 11/24/16 0541 11/24/16 0740  BP: (!) 131/44 (!) 137/57 125/61 (!) 130/53  Pulse: 99 (!) 107 93 94  Resp: 16 18 18    Temp:  98.3 F (36.8 C) 98.2 F (36.8 C) 97.9 F (36.6 C)  TempSrc:  Oral Oral Oral  SpO2: 93% 96% 97% 97%  Weight:      Height:        PHYSICAL EXAM:  Right lower extremity: I personally.changed the patient's dressing today. Neurovascular intact Sensation intact distally Intact pulses distally Dorsiflexion/Plantar flexion intact Incision: dressing C/D/I and no drainage No cellulitis present Compartment soft  LABS  Results for orders placed or performed during the hospital encounter of 11/21/16 (from the past 24 hour(s))  Glucose, capillary     Status: Abnormal   Collection Time: 11/23/16  4:29 PM  Result Value Ref Range   Glucose-Capillary 140 (H) 65 - 99 mg/dL  Glucose, capillary     Status: Abnormal   Collection Time: 11/23/16  9:32 PM  Result Value Ref Range   Glucose-Capillary 124 (H) 65 - 99 mg/dL   Comment 1 Notify RN   CBC     Status: Abnormal   Collection Time: 11/24/16  3:47 AM  Result Value Ref Range   WBC 11.9 (H) 3.6 - 11.0 K/uL   RBC 3.34 (L) 3.80 - 5.20 MIL/uL   Hemoglobin 9.9 (L) 12.0 - 16.0 g/dL   HCT 16.128.8 (L) 09.635.0 - 04.547.0 %   MCV 86.1 80.0 - 100.0 fL   MCH 29.7 26.0 - 34.0 pg   MCHC 34.5 32.0 - 36.0 g/dL   RDW 40.913.9 81.111.5 - 91.414.5 %   Platelets 223 150 - 440 K/uL  Glucose, capillary     Status: Abnormal   Collection Time: 11/24/16  7:37 AM  Result Value Ref Range   Glucose-Capillary 114 (H) 65 - 99 mg/dL  Glucose, capillary     Status: Abnormal   Collection Time: 11/24/16 11:20 AM  Result Value Ref Range   Glucose-Capillary 133 (H) 65 - 99 mg/dL     No results found.  Assessment/Plan: 3 Days Post-Op   Active Problems:   S/P total knee arthroplasty, right   Patient has done well postop. She is being discharged home today. Patient will continue elevation at home. She will use her knee immobilizer when up and ambulating. Patient may use her Polar Care for comfort. She is going to receive home health PT. Patient be on Lovenox for DVT prophylaxis. She may wear TED stockings until her next follow-up. She'll follow up with me in 10-14 days for wound check and staple removal in the office.   Juanell FairlyKRASINSKI, Beecher Furio , MD 11/24/2016, 1:45 PM

## 2016-12-06 ENCOUNTER — Emergency Department: Payer: Commercial Managed Care - PPO

## 2016-12-06 ENCOUNTER — Encounter: Payer: Self-pay | Admitting: Emergency Medicine

## 2016-12-06 DIAGNOSIS — Z79899 Other long term (current) drug therapy: Secondary | ICD-10-CM | POA: Insufficient documentation

## 2016-12-06 DIAGNOSIS — E119 Type 2 diabetes mellitus without complications: Secondary | ICD-10-CM | POA: Insufficient documentation

## 2016-12-06 DIAGNOSIS — Z7984 Long term (current) use of oral hypoglycemic drugs: Secondary | ICD-10-CM | POA: Diagnosis not present

## 2016-12-06 DIAGNOSIS — R0789 Other chest pain: Secondary | ICD-10-CM | POA: Diagnosis present

## 2016-12-06 DIAGNOSIS — J45909 Unspecified asthma, uncomplicated: Secondary | ICD-10-CM | POA: Insufficient documentation

## 2016-12-06 DIAGNOSIS — I10 Essential (primary) hypertension: Secondary | ICD-10-CM | POA: Diagnosis not present

## 2016-12-06 LAB — CBC
HEMATOCRIT: 37.7 % (ref 35.0–47.0)
HEMOGLOBIN: 12.4 g/dL (ref 12.0–16.0)
MCH: 28.9 pg (ref 26.0–34.0)
MCHC: 32.9 g/dL (ref 32.0–36.0)
MCV: 88 fL (ref 80.0–100.0)
Platelets: 691 10*3/uL — ABNORMAL HIGH (ref 150–440)
RBC: 4.29 MIL/uL (ref 3.80–5.20)
RDW: 14.7 % — ABNORMAL HIGH (ref 11.5–14.5)
WBC: 12.2 10*3/uL — ABNORMAL HIGH (ref 3.6–11.0)

## 2016-12-06 LAB — BASIC METABOLIC PANEL
ANION GAP: 10 (ref 5–15)
BUN: 11 mg/dL (ref 6–20)
CALCIUM: 10 mg/dL (ref 8.9–10.3)
CO2: 24 mmol/L (ref 22–32)
Chloride: 103 mmol/L (ref 101–111)
Creatinine, Ser: 0.64 mg/dL (ref 0.44–1.00)
Glucose, Bld: 140 mg/dL — ABNORMAL HIGH (ref 65–99)
POTASSIUM: 4.4 mmol/L (ref 3.5–5.1)
Sodium: 137 mmol/L (ref 135–145)

## 2016-12-06 LAB — TROPONIN I

## 2016-12-06 NOTE — ED Triage Notes (Signed)
Vitals for EMS: 154CBG 142/64 109 97% RA 98.1 12 lead unremarkable for EMS. Patient had right TKR on 11/20/16. Now taking Oxycodone and states the medication is "tearing her stomach up". Patient also has h/o GERD. Patient now c/o chest pain that feels like a burning pain behind right breast and "at my chest wall" through to right back. Patient also reports h/o arthritis. Patient has been using walker since surgery.

## 2016-12-07 ENCOUNTER — Emergency Department
Admission: EM | Admit: 2016-12-07 | Discharge: 2016-12-07 | Disposition: A | Discharge status: Home / Self Care | Payer: Commercial Managed Care - PPO | Attending: Emergency Medicine | Admitting: Emergency Medicine

## 2016-12-07 ENCOUNTER — Emergency Department: Payer: Commercial Managed Care - PPO

## 2016-12-07 DIAGNOSIS — R0789 Other chest pain: Secondary | ICD-10-CM

## 2016-12-07 LAB — TROPONIN I: Troponin I: <0.03 ng/mL (ref <0.03)

## 2016-12-07 MED ORDER — CYCLOBENZAPRINE HCL 10 MG PO TABS: 10.0000 mg | ORAL_TABLET | Freq: Three times a day (TID) | ORAL | 0 refills | Status: AC | PRN

## 2016-12-07 MED ORDER — IOPAMIDOL (ISOVUE-370) INJECTION 76%
75.0000 mL | Freq: Once | INTRAVENOUS | Status: AC | PRN
  Administered 2016-12-07: 75 mL via INTRAVENOUS

## 2016-12-07 NOTE — ED Notes (Signed)
Patient transported to CT 

## 2016-12-07 NOTE — ED Provider Notes (Signed)
[facility] Emergency Department Provider Note    First MD Initiated Contact with Patient 12/07/16 0210     (approximate)  I have reviewed the triage vital signs and the nursing notes.   HISTORY  Chief Complaint Chest Pain   HPI Janet Robles is a 71 y.o. female with below list of chronic medical conditions presents to the emergency department with right lateral chest pain described as burning inferior to the right breast "at my chest wall". Patient states that the pain radiates to her right back. Patient status post right knee replacement 11/20/2016. Patient's currently receiving Lovenox shots daily   Past Medical History:  Diagnosis Date  . Arthritis   . Asthma   . Diabetes mellitus without complication (HCC)   . Elevated lipids   . GERD (gastroesophageal reflux disease)   . Hypertension    patient states she is borderline hypertensive and may start meds after surgery  . Muscle spasms of neck   . Seasonal allergies     Patient Active Problem List   Diagnosis Date Noted  . S/P total knee arthroplasty, right 11/21/2016    Past Surgical History:  Procedure Laterality Date  . BREAST SURGERY Left 1990   biopsy  . BREAST SURGERY Right 1990   benign mass  . CESAREAN SECTION    . SEPTOPLASTY    . TOTAL KNEE ARTHROPLASTY Right 11/21/2016   Procedure: TOTAL KNEE ARTHROPLASTY;  Surgeon: Juanell Fairly, MD;  Location: [facility] ORS;  Service: Orthopedics;  Laterality: Right;    Prior to Admission medications   Medication Sig Start Date End Date Taking? Authorizing Provider  acetaminophen (TYLENOL) 500 MG tablet Take 500 mg by mouth every 6 (six) hours as needed.    Historical Provider, MD  atorvastatin (LIPITOR) 10 MG tablet Take 10 mg by mouth every evening. 03/03/16   Historical Provider, MD  Cholecalciferol (VITAMIN D3) 2000 units capsule Take 2,000 Units by mouth daily.    Historical Provider, MD  clobetasol ointment (TEMOVATE) 0.05 % Apply 1  application topically 2 (two) times daily as needed. For lichen sclerosus 02/10/13   Historical Provider, MD  enoxaparin (LOVENOX) 30 MG/0.3ML injection Inject 0.3 mLs (30 mg total) into the skin every 12 (twelve) hours. 11/24/16   Juanell Fairly, MD  esomeprazole (NEXIUM) 40 MG capsule Take 40 mg by mouth every evening. 05/29/16   Historical Provider, MD  Eyelid Cleansers (SYSTANE LID WIPES EX) Apply 1 application to eye 4 (four) times daily.     Historical Provider, MD  fluorouracil (EFUDEX) 5 % cream Apply 1 application topically daily as needed. For dermatitis 07/08/15   Historical Provider, MD  Fluticasone-Salmeterol (ADVAIR DISKUS) 100-50 MCG/DOSE AEPB Inhale 1 puff into the lungs every 12 (twelve) hours. 03/03/16   Historical Provider, MD  glipiZIDE (GLUCOTROL XL) 5 MG 24 hr tablet Take 5 mg by mouth daily. 03/03/16   Historical Provider, MD  ibuprofen (ADVIL,MOTRIN) 200 MG tablet Take 400 mg by mouth every 8 (eight) hours as needed (for pain.).    Historical Provider, MD  metFORMIN (GLUCOPHAGE-XR) 500 MG 24 hr tablet Take 500 mg by mouth 2 (two) times daily. 03/03/16   Historical Provider, MD  mupirocin nasal ointment (BACTROBAN) 2 % Place 1 application into the nose 3 (three) times daily. Use one-half of tube in each nostril twice daily for five (5) days. After application, press sides of nose together and gently massage.    Historical Provider, MD  oxyCODONE (OXY IR/ROXICODONE) 5 MG immediate release  tablet Take 1-2 tablets (5-10 mg total) by mouth every 3 (three) hours as needed for moderate pain or breakthrough pain. 11/24/16   Juanell Fairly, MD  Polyethyl Glycol-Propyl Glycol (SYSTANE OP) Place 1 drop into both eyes 4 (four) times daily.    Historical Provider, MD  sitaGLIPtin (JANUVIA) 100 MG tablet Take 100 mg by mouth every evening. 10/07/15   Historical Provider, MD  VENTOLIN HFA 108 (90 Base) MCG/ACT inhaler Inhale 1-2 puffs into the lungs every 6 (six) hours as needed for shortness of breath  or wheezing. 08/01/16   Historical Provider, MD  vitamin B-12 (CYANOCOBALAMIN) 1000 MCG tablet Take 1,000 mcg by mouth daily.    Historical Provider, MD    Allergies No known drug allergies No family history on file.  Social History Social History  Substance Use Topics  . Smoking status: Never Smoker  . Smokeless tobacco: Never Used  . Alcohol use No    Review of Systems Constitutional: No fever/chills Eyes: No visual changes. ENT: No sore throat. Cardiovascular: Denies chest pain. Respiratory: Denies shortness of breath. Gastrointestinal: No abdominal pain.  No nausea, no vomiting.  No diarrhea.  No constipation. Genitourinary: Negative for dysuria. Musculoskeletal: Negative for back pain. Skin: Negative for rash. Neurological: Negative for headaches, focal weakness or numbness.  10-point ROS otherwise negative.  ____________________________________________   PHYSICAL EXAM:  VITAL SIGNS: ED Triage Vitals  Enc Vitals Group     BP 12/06/16 2306 136/70     Pulse Rate 12/06/16 2306 (!) 112     Resp 12/06/16 2306 18     Temp 12/06/16 2306 98 F (36.7 C)     Temp Source 12/06/16 2306 Oral     SpO2 12/06/16 2306 97 %     Weight 12/06/16 2306 178 lb (80.7 kg)     Height 12/06/16 2306 5\' 6"  (1.676 m)     Head Circumference --      Peak Flow --      Pain Score 12/06/16 2307 7     Pain Loc --      Pain Edu? --      Excl. in GC? --     Constitutional: Alert and oriented. Well appearing and in no acute distress. Eyes: Conjunctivae are normal. PERRL. EOMI. Head: Atraumatic. Ears:  Healthy appearing ear canals and TMs bilaterally Nose: No congestion/rhinnorhea. Mouth/Throat: Mucous membranes are moist.  Oropharynx non-erythematous. Neck: No stridor.  No meningeal signs.  No cervical spine tenderness to palpation. Cardiovascular: Normal rate, regular rhythm. Good peripheral circulation. Grossly normal heart sounds. Respiratory: Normal respiratory effort.  No  retractions. Lungs CTAB. Gastrointestinal: Soft and nontender. No distention.  Musculoskeletal: No lower extremity tenderness nor edema. No gross deformities of extremities. Neurologic:  Normal speech and language. No gross focal neurologic deficits are appreciated.  Skin:  Skin is warm, dry and intact. No rash noted. Psychiatric: Mood and affect are normal. Speech and behavior are normal.  ____________________________________________   LABS (all labs ordered are listed, but only abnormal results are displayed)  Labs Reviewed  BASIC METABOLIC PANEL - Abnormal; Notable for the following:       Result Value   Glucose, Bld 140 (*)    All other components within normal limits  CBC - Abnormal; Notable for the following:    WBC 12.2 (*)    RDW 14.7 (*)    Platelets 691 (*)    All other components within normal limits  TROPONIN I  TROPONIN I   ____________________________________________  EKG  ED ECG REPORT I, [place] N Sahian Kerney, the attending physician, personally viewed and interpreted this ECG.   Date: 12/07/2016  EKG Time: 11:15 PM  Rate: 121  Rhythm: Normal sinus rhythm  Axis: Normal  Intervals: Normal  ST&T Change: None  ____________________________________________  RADIOLOGY I, [place] N Skyelar Swigart, personally viewed and evaluated these images (plain radiographs) as part of my medical decision making, as well as reviewing the written report by the radiologist.  Ct Angio Chest Pe W And/or Wo Contrast  Result Date: 12/07/2016 CLINICAL DATA:  Right-sided chest pain. History of right total knee arthroplasty in January. EXAM: CT ANGIOGRAPHY CHEST WITH CONTRAST TECHNIQUE: Multidetector CT imaging of the chest was performed using the standard protocol during bolus administration of intravenous contrast. Multiplanar CT image reconstructions and MIPs were obtained to evaluate the vascular anatomy. CONTRAST:  75 cc Isovue 370 IV COMPARISON:  CXR from 12/07/2016 FINDINGS:  Cardiovascular: Normal branch pattern for the great vessels off the aortic arch. No aortic aneurysm or dissection. No large central pulmonary embolus. Heart is normal in size. No pericardial effusion. LAD coronary arteriosclerosis. Mediastinum/Nodes: No adenopathy. Mild gaseous distention of the esophagus without focal mass or mural thickening. Patent trachea and mainstem bronchi. Lungs/Pleura: Subpleural blebs bilaterally with atelectasis both lower lobes. No pulmonary consolidation, mass, effusion or pneumothorax. Upper Abdomen: No acute abnormality. Musculoskeletal: Mild degenerate change along the dorsal spine. No acute osseous abnormality. Review of the MIP images confirms the above findings. IMPRESSION: Coronary arteriosclerosis along the LAD. No acute pulmonary embolus. Minimal atelectasis and subpleural blebs within both lower lobes. No acute pulmonary abnormality. Electronically Signed   By: Tollie Ethavid  Kwon M.D.   On: 12/07/2016 03:06   Dg Chest Portable 1 View  Result Date: 12/07/2016 CLINICAL DATA:  Right-sided chest pain. EXAM: PORTABLE CHEST 1 VIEW COMPARISON:  None. FINDINGS: The heart size and mediastinal contours are within normal limits. Both lungs are clear. The visualized skeletal structures are unremarkable. IMPRESSION: No active disease. Electronically Signed   By: Burman NievesWilliam  Stevens M.D.   On: 12/07/2016 02:08     Procedures    INITIAL IMPRESSION / ASSESSMENT AND PLAN / ED COURSE  Pertinent labs & imaging results that were available during my care of the patient were reviewed by me and considered in my medical decision making (see chart for details).  71 year old female presenting with right lateral  pain status post knee replacement surgery. Patient currently receiving Lovenox injections as such decreased risk for pulmonary emboli however CT scan was performed which revealed no evidence of pulmonary emboli. In addition considered possibly of cardiac etiology however EKG revealed no  evidence of ischemia or infarction. Troponin negative 2. Patient was indeed notified of arteriosclerosis noted in the LAD region on the CT scan and advised to follow-up with primary care provider/cardiologist.      ____________________________________________  FINAL CLINICAL IMPRESSION(S) / ED DIAGNOSES  Final diagnoses:  Chest wall pain  Nonspecific chest pain   MEDICATIONS GIVEN DURING THIS VISIT:  Medications  iopamidol (ISOVUE-370) 76 % injection 75 mL (75 mLs Intravenous Contrast Given 12/07/16 0246)     NEW OUTPATIENT MEDICATIONS STARTED DURING THIS VISIT:  New Prescriptions   No medications on file    Modified Medications   No medications on file    Discontinued Medications   No medications on file     Note:  This document was prepared using Dragon voice recognition software and may include unintentional dictation errors.    Darci Currentandolph N Murice Barbar, MD 12/07/16 [phone]

## 2018-01-02 ENCOUNTER — Other Ambulatory Visit: Payer: Self-pay | Admitting: Family Medicine

## 2018-01-02 DIAGNOSIS — M8589 Other specified disorders of bone density and structure, multiple sites: Secondary | ICD-10-CM

## 2021-03-15 ENCOUNTER — Other Ambulatory Visit: Payer: Self-pay | Admitting: Neurology

## 2021-03-15 DIAGNOSIS — G9389 Other specified disorders of brain: Secondary | ICD-10-CM

## 2021-03-15 DIAGNOSIS — Z8673 Personal history of transient ischemic attack (TIA), and cerebral infarction without residual deficits: Secondary | ICD-10-CM

## 2021-04-14 ENCOUNTER — Other Ambulatory Visit: Payer: Commercial Managed Care - PPO

## 2021-04-16 ENCOUNTER — Ambulatory Visit
Admission: RE | Admit: 2021-04-16 | Discharge: 2021-04-16 | Disposition: A | Discharge status: Home / Self Care | Payer: Commercial Managed Care - PPO | Source: Ambulatory Visit | Attending: Neurology | Admitting: Neurology

## 2021-04-16 ENCOUNTER — Other Ambulatory Visit: Payer: Self-pay

## 2021-04-16 DIAGNOSIS — Z8673 Personal history of transient ischemic attack (TIA), and cerebral infarction without residual deficits: Secondary | ICD-10-CM

## 2021-04-16 DIAGNOSIS — G9389 Other specified disorders of brain: Secondary | ICD-10-CM

## 2021-12-17 IMAGING — MR MR HEAD W/O CM
11 series · 48 of 48 positions shown · non-contrast
Comparison: Report from brain MRI 10/13/2020 (images unavailable).

CLINICAL DATA: Encephalomalacia.  History of stroke.

EXAM:
MRI HEAD WITHOUT CONTRAST
TECHNIQUE: Multiplanar, multiecho pulse sequences of the brain and surrounding
structures were obtained without intravenous contrast.

[Series 5: T1 · sagittal · 4.0mm · 0.75mm/px · 2 of 31 slices shown (1 of 2)]
[im 1/31]
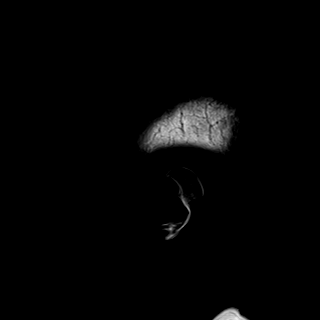
[im 31/31]
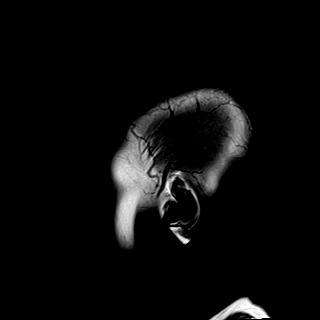

[Series 6: DWI · axial · 3.0mm · 0.94mm/px · z∈[-29,+103]mm · 10 of 160 slices shown (1 of 3)]
[im 1/160]
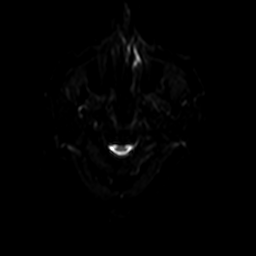
[im 18/160]
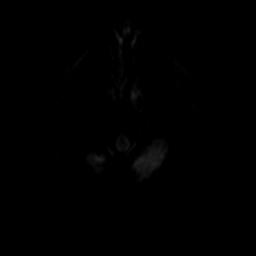
[im 36/160]
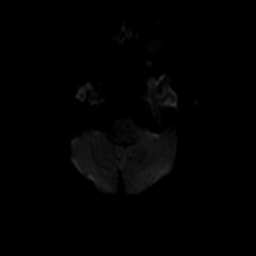
[im 54/160]
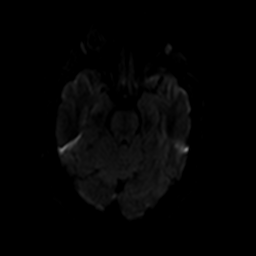
[im 71/160]
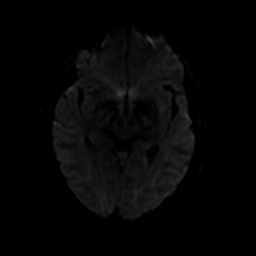
[im 89/160]
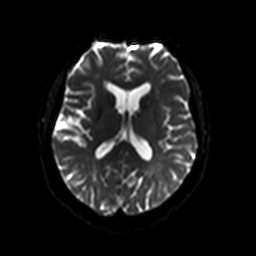
[im 107/160]
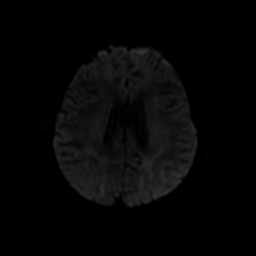
[im 124/160]
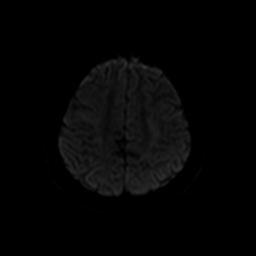
[im 142/160]
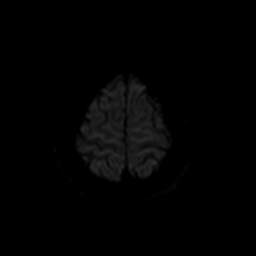
[im 160/160]
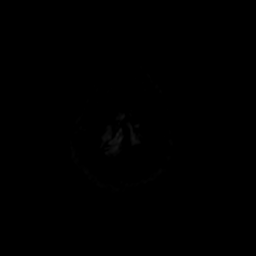

[Series 7: ax dwi_tracew · axial · 3.0mm · 0.94mm/px · z∈[-29,+103]mm · 5 of 80 slices shown]
[im 1/80]
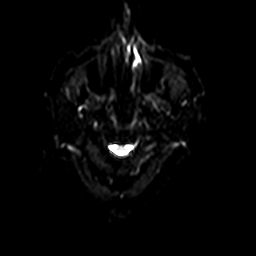
[im 20/80]
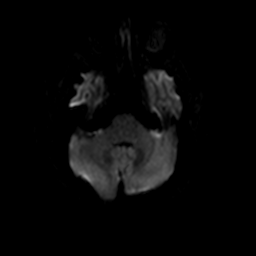
[im 40/80]
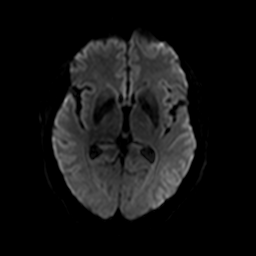
[im 60/80]
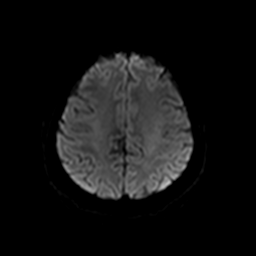
[im 80/80]
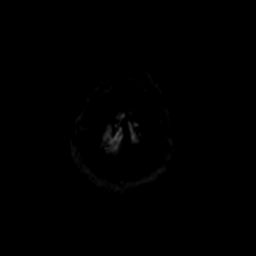

[Series 8: ax dwi_adc · axial · 3.0mm · 0.94mm/px · z∈[-29,+103]mm · 3 of 40 slices shown]
[im 1/40]
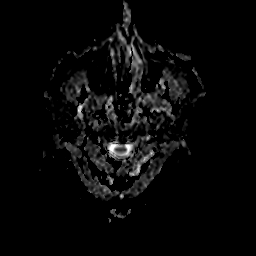
[im 20/40]
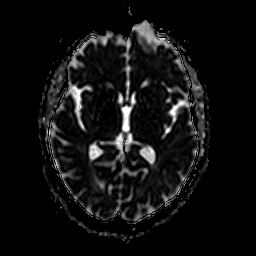
[im 40/40]
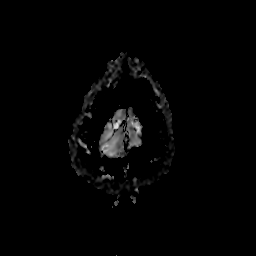

[Series 9: DWI · coronal · 5.0mm · 1.44mm/px · 4 of 60 slices shown (2 of 3)]
[im 1/60]
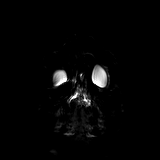
[im 20/60]
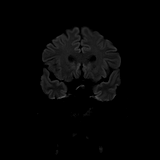
[im 40/60]
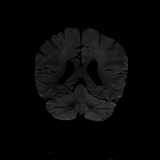
[im 60/60]
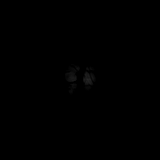

[Series 10: DWI · coronal · 5.0mm · 1.44mm/px · 2 of 30 slices shown (3 of 3)]
[im 1/30]
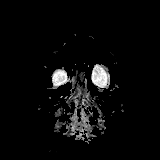
[im 30/30]
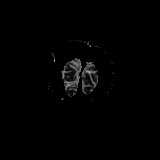

[Series 11: T2 · axial · 4.0mm · 0.36mm/px · z∈[-30,+99]mm · 2 of 27 slices shown (1 of 2)]
[im 1/27]
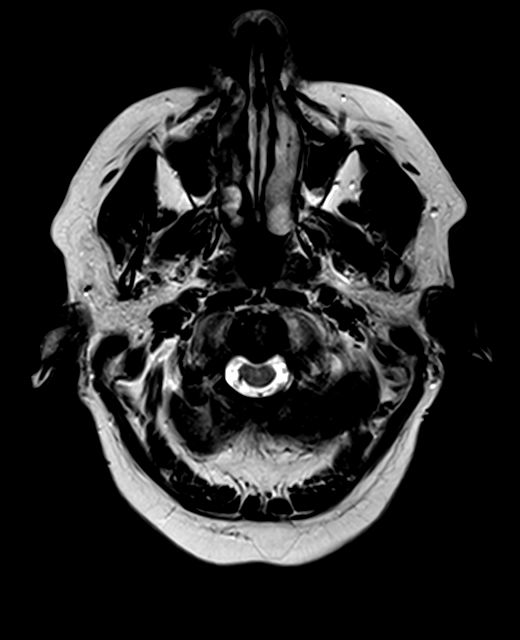
[im 27/27]
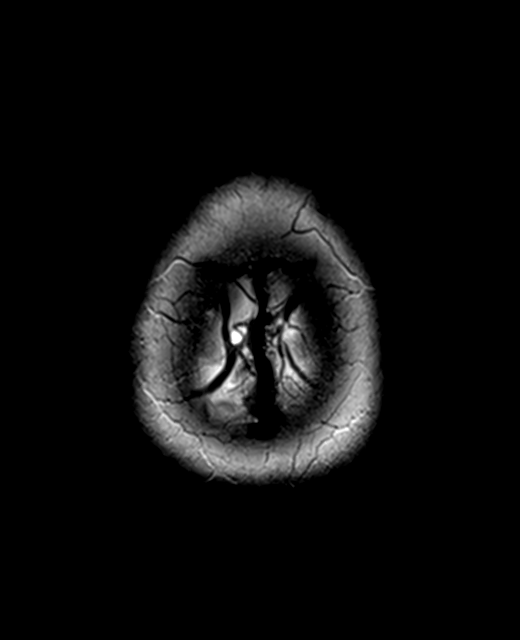

[Series 12: FLAIR · axial · 3.0mm · 0.72mm/px · z∈[-36,+108]mm · 2 of 26 slices shown]
[im 1/26]
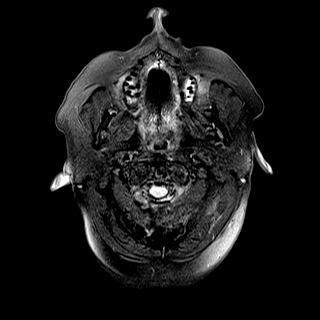
[im 26/26]
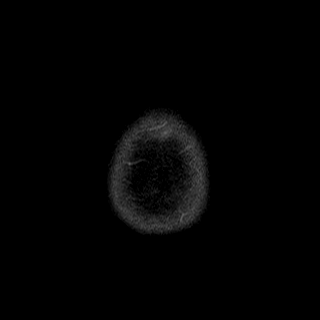

[Series 13: swi_images · axial · 1.5mm · 0.90mm/px · z∈[-34,+103]mm · 6 of 96 slices shown]
[im 1/96]
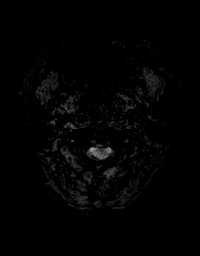
[im 20/96]
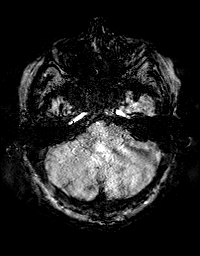
[im 39/96]
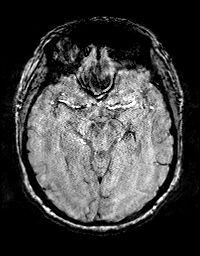
[im 58/96]
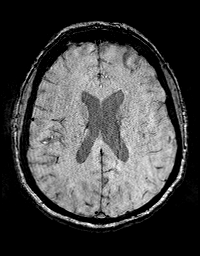
[im 77/96]
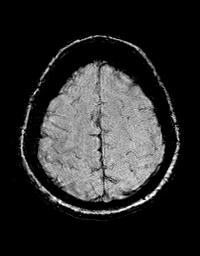
[im 96/96]
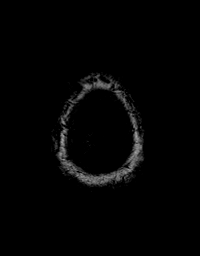

[Series 15: T1 · axial · 1.0mm · 0.94mm/px · z∈[-40,+112]mm · 10 of 160 slices shown (2 of 2)]
[im 1/160]
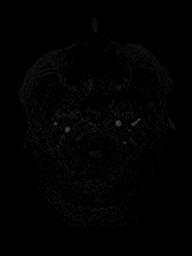
[im 18/160]
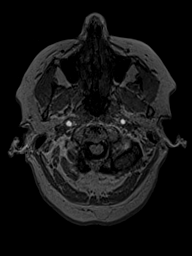
[im 36/160]
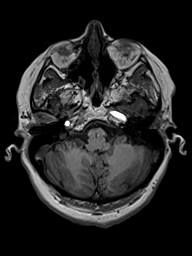
[im 54/160]
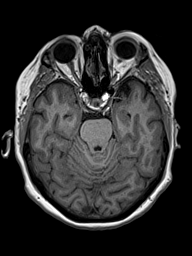
[im 71/160]
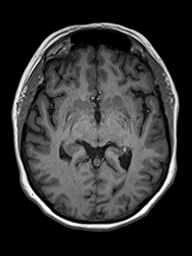
[im 89/160]
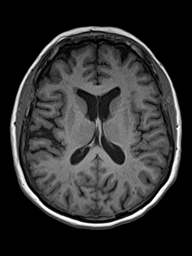
[im 107/160]
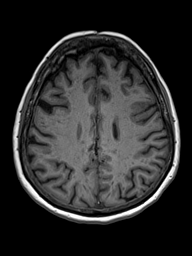
[im 124/160]
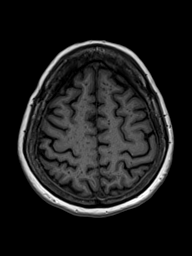
[im 142/160]
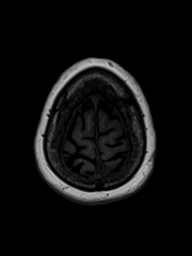
[im 160/160]
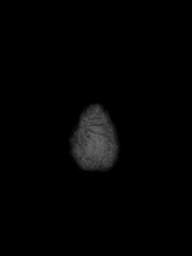

[Series 16: T2 · coronal · 4.5mm · 0.36mm/px · 2 of 30 slices shown (2 of 2)]
[im 1/30]
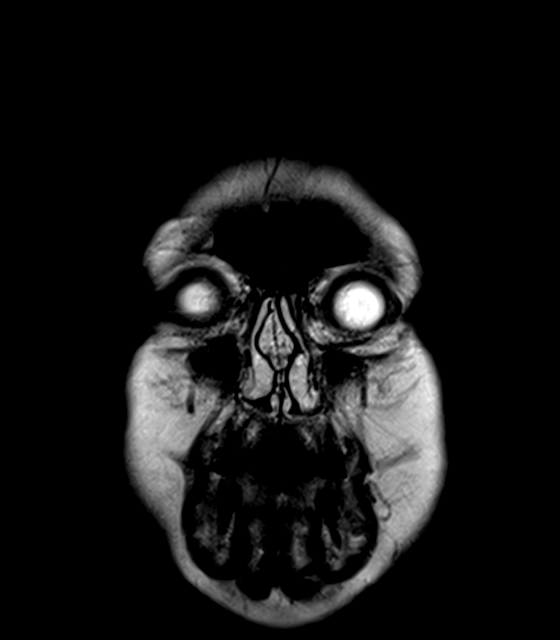
[im 30/30]
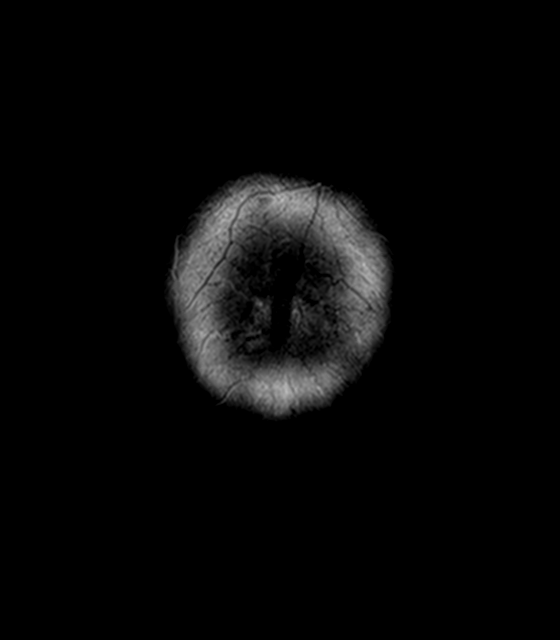

[48 of 48 positions shown; findings below may reference images not displayed]

FINDINGS: Brain:

Mild intermittent motion degradation.

Mild generalized cerebral and cerebellar atrophy.

Punctate focus of cortical restricted diffusion within the medial
right frontal lobe (ACA vascular territory) compatible with punctate
acute or subacute infarct (series 6, image 154) (series 7, image 74)
(series 9, image 44).

There is a small focus of chronic cortical encephalomalacia within
the posterior paramedian right frontal lobe and adjacent right
parietal lobe. There is curvilinear SWI signal loss along the medial
right frontoparietal lobes at this site, and along the adjacent
medial left parietooccipital lobes, suggesting prior hemorrhage.

Mild multifocal T2/FLAIR hyperintensity within the cerebral white
matter, nonspecific but compatible with chronic small vessel
ischemic disease.

No evidence of an intracranial mass.

No extra-axial fluid collection.

No midline shift.

Vascular: Expected proximal arterial flow voids.

Skull and upper cervical spine: No focal marrow lesion. Incompletely
assessed cervical spondylosis. A C4-C5 posterior disc osteophyte
complex contributes to apparent mild spinal canal stenosis.

Sinuses/Orbits: Visualized orbits show no acute finding. Bilateral
lens replacements. Postsurgical appearance of the paranasal sinuses
with mild scattered paranasal sinus mucosal thickening.

Impression #1 will be called to the ordering clinician or
representative by the Radiologist Assistant, and communication
documented in the PACS or [REDACTED].
IMPRESSION: Punctate focus of restricted diffusion within the medial right
frontal lobe compatible with acute/subacute infarct (ACA vascular
territory).

Small focus of chronic cortical encephalomalacia within the
posterior medial right frontal lobe and adjacent right parietal
lobe. Associated chronic hemosiderin deposition at this site, and
along the adjacent medial left frontoparietal lobes, suggesting
prior hemorrhage.

Mild cerebral white matter chronic small vessel ischemic disease.

Mild generalized parenchymal atrophy.

## 2022-07-07 ENCOUNTER — Encounter: Admission: RE | Payer: Self-pay | Source: Ambulatory Visit

## 2022-07-07 ENCOUNTER — Ambulatory Visit: Admission: RE | Admit: 2022-07-07 | Payer: Commercial Managed Care - PPO | Source: Ambulatory Visit

## 2022-07-07 SURGERY — COLONOSCOPY WITH PROPOFOL
Anesthesia: General

## 2022-11-16 ENCOUNTER — Other Ambulatory Visit: Payer: Self-pay | Admitting: Gastroenterology

## 2022-11-16 DIAGNOSIS — R1313 Dysphagia, pharyngeal phase: Secondary | ICD-10-CM

## 2022-12-11 ENCOUNTER — Ambulatory Visit
Admission: RE | Admit: 2022-12-11 | Discharge: 2022-12-11 | Disposition: A | Discharge status: Home / Self Care | Payer: Commercial Managed Care - PPO | Source: Ambulatory Visit | Attending: Gastroenterology | Admitting: Gastroenterology

## 2022-12-11 ENCOUNTER — Ambulatory Visit: Payer: Commercial Managed Care - PPO

## 2022-12-11 DIAGNOSIS — R1313 Dysphagia, pharyngeal phase: Secondary | ICD-10-CM | POA: Insufficient documentation

## 2023-02-26 ENCOUNTER — Ambulatory Visit
Admission: RE | Admit: 2023-02-26 | Discharge: 2023-02-26 | Disposition: A | Discharge status: Home / Self Care | Payer: Commercial Managed Care - PPO | Attending: Gastroenterology | Admitting: Gastroenterology

## 2023-02-26 ENCOUNTER — Encounter
Admission: RE | Disposition: A | Discharge status: Home / Self Care | Payer: Self-pay | Source: Home / Self Care | Attending: Gastroenterology

## 2023-02-26 ENCOUNTER — Ambulatory Visit: Payer: Commercial Managed Care - PPO | Admitting: Anesthesiology

## 2023-02-26 DIAGNOSIS — J45909 Unspecified asthma, uncomplicated: Secondary | ICD-10-CM | POA: Insufficient documentation

## 2023-02-26 DIAGNOSIS — K317 Polyp of stomach and duodenum: Secondary | ICD-10-CM | POA: Insufficient documentation

## 2023-02-26 DIAGNOSIS — K219 Gastro-esophageal reflux disease without esophagitis: Secondary | ICD-10-CM | POA: Insufficient documentation

## 2023-02-26 DIAGNOSIS — R933 Abnormal findings on diagnostic imaging of other parts of digestive tract: Secondary | ICD-10-CM | POA: Insufficient documentation

## 2023-02-26 DIAGNOSIS — I10 Essential (primary) hypertension: Secondary | ICD-10-CM | POA: Diagnosis not present

## 2023-02-26 DIAGNOSIS — E119 Type 2 diabetes mellitus without complications: Secondary | ICD-10-CM | POA: Diagnosis not present

## 2023-02-26 DIAGNOSIS — Z800 Family history of malignant neoplasm of digestive organs: Secondary | ICD-10-CM | POA: Diagnosis not present

## 2023-02-26 DIAGNOSIS — Q399 Congenital malformation of esophagus, unspecified: Secondary | ICD-10-CM | POA: Diagnosis not present

## 2023-02-26 DIAGNOSIS — R1314 Dysphagia, pharyngoesophageal phase: Secondary | ICD-10-CM | POA: Diagnosis present

## 2023-02-26 HISTORY — PX: ESOPHAGOGASTRODUODENOSCOPY (EGD) WITH PROPOFOL: SHX5813

## 2023-02-26 LAB — GLUCOSE, CAPILLARY: Glucose-Capillary: 209 mg/dL — ABNORMAL HIGH (ref 70–99)

## 2023-02-26 SURGERY — ESOPHAGOGASTRODUODENOSCOPY (EGD) WITH PROPOFOL
Anesthesia: General

## 2023-02-26 MED ORDER — LIDOCAINE HCL (CARDIAC) PF 100 MG/5ML IV SOSY
PREFILLED_SYRINGE | INTRAVENOUS | Status: DC | PRN
  Administered 2023-02-26: 100 mg via INTRAVENOUS

## 2023-02-26 MED ORDER — GLYCOPYRROLATE 0.2 MG/ML IJ SOLN
INTRAMUSCULAR | Status: DC | PRN
  Administered 2023-02-26: .2 mg via INTRAVENOUS

## 2023-02-26 MED ORDER — PROPOFOL 10 MG/ML IV BOLUS
INTRAVENOUS | Status: DC | PRN
  Administered 2023-02-26: 50 mg via INTRAVENOUS
  Administered 2023-02-26: 10 mg via INTRAVENOUS
  Administered 2023-02-26: 20 mg via INTRAVENOUS

## 2023-02-26 MED ORDER — PROPOFOL 500 MG/50ML IV EMUL
INTRAVENOUS | Status: DC | PRN
  Administered 2023-02-26: 145 ug/kg/min via INTRAVENOUS

## 2023-02-26 MED ORDER — DEXMEDETOMIDINE HCL IN NACL 200 MCG/50ML IV SOLN
INTRAVENOUS | Status: DC | PRN
  Administered 2023-02-26: 8 ug via INTRAVENOUS

## 2023-02-26 MED ORDER — SODIUM CHLORIDE 0.9 % IV SOLN
INTRAVENOUS | Status: DC
  Administered 2023-02-26: 20 mL/h via INTRAVENOUS

## 2023-02-26 NOTE — Interval H&P Note (Signed)
History and Physical Interval Note:  02/26/2023 9:42 AM  Janet Robles  has presented today for surgery, with the diagnosis of abnormal barium swallow, pharyngoesophageal dysphagia.  The various methods of treatment have been discussed with the patient and family. After consideration of risks, benefits and other options for treatment, the patient has consented to  Procedure(s) with comments: ESOPHAGOGASTRODUODENOSCOPY (EGD) WITH PROPOFOL (N/A) - DM as a surgical intervention.  The patient's history has been reviewed, patient examined, no change in status, stable for surgery.  I have reviewed the patient's chart and labs.  Questions were answered to the patient's satisfaction.     Regis Bill  Ok to proceed with EGD

## 2023-02-26 NOTE — Transfer of Care (Signed)
Immediate Anesthesia Transfer of Care Note  Patient: Janet Robles  Procedure(s) Performed: ESOPHAGOGASTRODUODENOSCOPY (EGD) WITH PROPOFOL  Patient Location: Endoscopy Unit  Anesthesia Type:General  Level of Consciousness: drowsy and patient cooperative  Airway & Oxygen Therapy: Patient Spontanous Breathing and Patient connected to face mask oxygen  Post-op Assessment: Report given to RN and Post -op Vital signs reviewed and stable  Post vital signs: Reviewed and stable  Last Vitals:  Vitals Value Taken Time  BP 110/53 02/26/23 1001  Temp 36.1 C 02/26/23 1000  Pulse 84 02/26/23 1009  Resp 15 02/26/23 1009  SpO2 100 % 02/26/23 1009  Vitals shown include unvalidated device data.  Last Pain:  Vitals:   02/26/23 1000  TempSrc: Temporal  PainSc: Asleep         Complications: No notable events documented.

## 2023-02-26 NOTE — H&P (Signed)
Outpatient short stay form Pre-procedure 02/26/2023  Janet Bill, MD  Primary Physician: Lazarus Gowda, MD  Reason for visit:  Abnormal imaging  History of present illness:    77 y/o lady with history of asthma, DM II, and hypertension here for EGD for dysphagia and abnormal barium swallow. No blood thinners. Had an aunt with stomach cancer. No significant neck or abdominal surgeries.    Current Facility-Administered Medications:    0.9 %  sodium chloride infusion, , Intravenous, Continuous, Constancia Geeting, Rossie Muskrat, MD, Last Rate: 20 mL/hr at 02/26/23 0904, 20 mL/hr at 02/26/23 0904  Medications Prior to Admission  Medication Sig Dispense Refill Last Dose   acetaminophen (TYLENOL) 500 MG tablet Take 500 mg by mouth every 6 (six) hours as needed.   02/25/2023   atorvastatin (LIPITOR) 10 MG tablet Take 10 mg by mouth every evening.   02/25/2023   Cholecalciferol (VITAMIN D3) 2000 units capsule Take 2,000 Units by mouth daily.   02/25/2023   clobetasol ointment (TEMOVATE) 0.05 % Apply 1 application topically 2 (two) times daily as needed. For lichen sclerosus   02/25/2023   cyclobenzaprine (FLEXERIL) 10 MG tablet Take 1 tablet (10 mg total) by mouth 3 (three) times daily as needed. 30 tablet 0 02/25/2023   Eyelid Cleansers (SYSTANE LID WIPES EX) Apply 1 application to eye 4 (four) times daily.    02/25/2023   ferrous sulfate 325 (65 FE) MG tablet Take 325 mg by mouth daily with breakfast.   02/25/2023   fluorouracil (EFUDEX) 5 % cream Apply 1 application topically daily as needed. For dermatitis   02/25/2023   glipiZIDE (GLUCOTROL XL) 5 MG 24 hr tablet Take 5 mg by mouth daily.   02/25/2023   ibuprofen (ADVIL,MOTRIN) 200 MG tablet Take 400 mg by mouth every 8 (eight) hours as needed (for pain.).   02/25/2023   levothyroxine (SYNTHROID) 50 MCG tablet Take 50 mcg by mouth daily before breakfast.   02/25/2023   losartan (COZAAR) 50 MG tablet Take 50 mg by mouth daily.   02/25/2023   metFORMIN (GLUCOPHAGE-XR)  500 MG 24 hr tablet Take 500 mg by mouth 2 (two) times daily.   02/25/2023   mupirocin nasal ointment (BACTROBAN) 2 % Place 1 application into the nose 3 (three) times daily. Use one-half of tube in each nostril twice daily for five (5) days. After application, press sides of nose together and gently massage.   02/25/2023   omeprazole (PRILOSEC) 20 MG capsule Take 40 mg by mouth daily.   02/25/2023   pioglitazone (ACTOS) 15 MG tablet Take 15 mg by mouth daily.   02/25/2023   Polyethyl Glycol-Propyl Glycol (SYSTANE OP) Place 1 drop into both eyes 4 (four) times daily.   02/25/2023   sitaGLIPtin (JANUVIA) 100 MG tablet Take 100 mg by mouth every evening.   02/25/2023   VENTOLIN HFA 108 (90 Base) MCG/ACT inhaler Inhale 1-2 puffs into the lungs every 6 (six) hours as needed for shortness of breath or wheezing.  2 02/25/2023   vitamin B-12 (CYANOCOBALAMIN) 1000 MCG tablet Take 1,000 mcg by mouth daily.   02/25/2023   enoxaparin (LOVENOX) 30 MG/0.3ML injection Inject 0.3 mLs (30 mg total) into the skin every 12 (twelve) hours. (Patient not taking: Reported on 02/19/2023) 30 Syringe 0 Not Taking   esomeprazole (NEXIUM) 40 MG capsule Take 40 mg by mouth every evening. (Patient not taking: Reported on 02/19/2023)   Not Taking   Fluticasone-Salmeterol (ADVAIR DISKUS) 100-50 MCG/DOSE AEPB Inhale 1  puff into the lungs every 12 (twelve) hours. (Patient not taking: Reported on 02/26/2023)   Completed Course   oxyCODONE (OXY IR/ROXICODONE) 5 MG immediate release tablet Take 1-2 tablets (5-10 mg total) by mouth every 3 (three) hours as needed for moderate pain or breakthrough pain. (Patient not taking: Reported on 02/19/2023) 60 tablet 0 Not Taking     Allergies  Allergen Reactions   Keflex [Cephalexin]    Lisinopril    Macrobid [Nitrofurantoin]      Past Medical History:  Diagnosis Date   Arthritis    Asthma    Diabetes mellitus without complication (HCC)    Elevated lipids    GERD (gastroesophageal reflux disease)     Hypertension    patient states she is borderline hypertensive and may start meds after surgery   Muscle spasms of neck    Seasonal allergies     Review of systems:  Otherwise negative.    Physical Exam  Gen: Alert, oriented. Appears stated age.  HEENT: PERRLA. Lungs: No respiratory distress CV: RRR Abd: soft, benign, no masses Ext: No edema    Planned procedures: Proceed with EGD. The patient understands the nature of the planned procedure, indications, risks, alternatives and potential complications including but not limited to bleeding, infection, perforation, damage to internal organs and possible oversedation/side effects from anesthesia. The patient agrees and gives consent to proceed.  Please refer to procedure notes for findings, recommendations and patient disposition/instructions.     Janet Bill, MD [facility]

## 2023-02-26 NOTE — Anesthesia Procedure Notes (Signed)
Procedure Name: General with mask airway Date/Time: 02/26/2023 9:49 AM  Performed by: Mohammed Kindle, CRNAPre-anesthesia Checklist: Patient identified, Emergency Drugs available, Suction available and Patient being monitored Patient Re-evaluated:Patient Re-evaluated prior to induction Oxygen Delivery Method: Simple face mask Induction Type: IV induction Placement Confirmation: positive ETCO2, CO2 detector and breath sounds checked- equal and bilateral Dental Injury: Teeth and Oropharynx as per pre-operative assessment

## 2023-02-26 NOTE — Anesthesia Preprocedure Evaluation (Signed)
Anesthesia Evaluation  Patient identified by MRN, date of birth, ID band Patient awake    Reviewed: Allergy & Precautions, NPO status , Patient's Chart, lab work & pertinent test results  History of Anesthesia Complications Negative for: history of anesthetic complications  Airway Mallampati: III  TM Distance: <3 FB Neck ROM: full    Dental  (+) Chipped   Pulmonary neg shortness of breath, asthma    Pulmonary exam normal        Cardiovascular Exercise Tolerance: Good hypertension, (-) angina Normal cardiovascular exam     Neuro/Psych  Neuromuscular disease  negative psych ROS   GI/Hepatic Neg liver ROS,GERD  Controlled,,  Endo/Other  diabetes, Type 2    Renal/GU negative Renal ROS  negative genitourinary   Musculoskeletal   Abdominal   Peds  Hematology negative hematology ROS (+)   Anesthesia Other Findings Patient reports that they do not think that any food or pills are stuck in their throat at this time.  Past Medical History: No date: Arthritis No date: Asthma No date: Diabetes mellitus without complication (HCC) No date: Elevated lipids No date: GERD (gastroesophageal reflux disease) No date: Hypertension     Comment:  patient states she is borderline hypertensive and may               start meds after surgery No date: Muscle spasms of neck No date: Seasonal allergies  Past Surgical History: 1990: BREAST SURGERY; Left     Comment:  biopsy 1990: BREAST SURGERY; Right     Comment:  benign mass No date: CESAREAN SECTION No date: SEPTOPLASTY 11/21/2016: TOTAL KNEE ARTHROPLASTY; Right     Comment:  Procedure: TOTAL KNEE ARTHROPLASTY;  Surgeon: Juanell Fairly, MD;  Location: [facility] ORS;  Service:               Orthopedics;  Laterality: Right;  BMI    Body Mass Index: 28.34 kg/m      Reproductive/Obstetrics negative OB ROS                              Anesthesia Physical Anesthesia Plan  ASA: 3  Anesthesia Plan: General   Post-op Pain Management:    Induction: Intravenous  PONV Risk Score and Plan: Propofol infusion and TIVA  Airway Management Planned: Natural Airway and Nasal Cannula  Additional Equipment:   Intra-op Plan:   Post-operative Plan:   Informed Consent: I have reviewed the patients History and Physical, chart, labs and discussed the procedure including the risks, benefits and alternatives for the proposed anesthesia with the patient or authorized representative who has indicated his/her understanding and acceptance.     Dental Advisory Given  Plan Discussed with: Anesthesiologist, CRNA and Surgeon  Anesthesia Plan Comments: (Patient consented for risks of anesthesia including but not limited to:  - adverse reactions to medications - risk of airway placement if required - damage to eyes, teeth, lips or other oral mucosa - nerve damage due to positioning  - sore throat or hoarseness - Damage to heart, brain, nerves, lungs, other parts of body or loss of life  Patient voiced understanding.)       Anesthesia Quick Evaluation

## 2023-02-26 NOTE — Op Note (Signed)
[facility] Patient Name: Janet Robles Procedure Date: 02/26/2023 9:32 AM MRN: [id number] Account #: [id number] Date of Birth: 09-Aug-1946 Admit Type: Outpatient Age: 76 Room: [facility] ENDO ROOM 3 Gender: Female Note Status: Finalized Instrument Name: Upper Endoscope [id number] Procedure:             Upper GI endoscopy Indications:           Dysphagia, Abnormal UGI series Providers:             Eather Colas MD, MD Referring MD:          Vevelyn Francois (Referring MD) Medicines:             Monitored Anesthesia Care Complications:         No immediate complications. Estimated blood loss:                         Minimal. Procedure:             Pre-Anesthesia Assessment:                        - Prior to the procedure, a History and Physical was                         performed, and patient medications and allergies were                         reviewed. The patient is competent. The risks and                         benefits of the procedure and the sedation options and                         risks were discussed with the patient. All questions                         were answered and informed consent was obtained.                         Patient identification and proposed procedure were                         verified by the physician, the nurse, the                         anesthesiologist, the anesthetist and the technician                         in the endoscopy suite. Mental Status Examination:                         alert and oriented. Airway Examination: normal                         oropharyngeal airway and neck mobility. Respiratory                         Examination: clear to auscultation. CV Examination:  normal. Prophylactic Antibiotics: The patient does not                         require prophylactic antibiotics. Prior                         Anticoagulants: The patient has taken no anticoagulant                          or antiplatelet agents. ASA Grade Assessment: III - A                         patient with severe systemic disease. After reviewing                         the risks and benefits, the patient was deemed in                         satisfactory condition to undergo the procedure. The                         anesthesia plan was to use monitored anesthesia care                         (MAC). Immediately prior to administration of                         medications, the patient was re-assessed for adequacy                         to receive sedatives. The heart rate, respiratory                         rate, oxygen saturations, blood pressure, adequacy of                         pulmonary ventilation, and response to care were                         monitored throughout the procedure. The physical                         status of the patient was re-assessed after the                         procedure.                        After obtaining informed consent, the endoscope was                         passed under direct vision. Throughout the procedure,                         the patient's blood pressure, pulse, and oxygen                         saturations were monitored continuously. The Endoscope  was introduced through the mouth, and advanced to the                         second part of duodenum. The upper GI endoscopy was                         accomplished without difficulty. The patient tolerated                         the procedure well. Findings:      The examined esophagus was mildly tortuous.      The exam of the esophagus was otherwise normal.      Multiple small pedunculated and sessile fundic gland polyps with no       bleeding and no stigmata of recent bleeding were found in the gastric       fundus and in the gastric body. Biopsies were taken with a cold forceps       for histology. Estimated blood loss was minimal.      The exam of  the stomach was otherwise normal.      The examined duodenum was normal. Impression:            - Tortuous esophagus.                        - Multiple fundic gland polyps. Biopsied.                        - Normal examined duodenum. Recommendation:        - Discharge patient to home.                        - Resume previous diet.                        - Continue present medications.                        - Await pathology results.                        - Return to referring physician as previously                         scheduled. Procedure Code(s):     --- Professional ---                        [phone], Esophagogastroduodenoscopy, flexible,                         transoral; with biopsy, single or multiple Diagnosis Code(s):     --- Professional ---                        Q39.9, Congenital malformation of esophagus,                         unspecified                        K31.7, Polyp of stomach and duodenum  R13.10, Dysphagia, unspecified                        R93.3, Abnormal findings on diagnostic imaging of                         other parts of digestive tract CPT copyright 2022 [employer]. All rights reserved. The codes documented in this report are preliminary and upon coder review may  be revised to meet current compliance requirements. Eather Colas MD, MD 02/26/2023 10:06:55 AM Number of Addenda: 0 Note Initiated On: 02/26/2023 9:32 AM Estimated Blood Loss:  Estimated blood loss was minimal.      [facility]

## 2023-02-26 NOTE — Anesthesia Postprocedure Evaluation (Signed)
Anesthesia Post Note  Patient: Glynis Smiles  Procedure(s) Performed: ESOPHAGOGASTRODUODENOSCOPY (EGD) WITH PROPOFOL  Patient location during evaluation: Endoscopy Anesthesia Type: General Level of consciousness: awake and alert Pain management: pain level controlled Vital Signs Assessment: post-procedure vital signs reviewed and stable Respiratory status: spontaneous breathing, nonlabored ventilation, respiratory function stable and patient connected to nasal cannula oxygen Cardiovascular status: blood pressure returned to baseline and stable Postop Assessment: no apparent nausea or vomiting Anesthetic complications: no   No notable events documented.   Last Vitals:  Vitals:   02/26/23 1010 02/26/23 1020  BP: (!) 100/44 (!) 122/50  Pulse:    Resp:    Temp:    SpO2:      Last Pain:  Vitals:   02/26/23 1020  TempSrc:   PainSc: 0-No pain                 Cleda Mccreedy Ameshia Pewitt

## 2023-02-27 ENCOUNTER — Encounter: Payer: Self-pay | Admitting: Gastroenterology

## 2023-02-27 LAB — SURGICAL PATHOLOGY

## 2023-08-20 ENCOUNTER — Ambulatory Visit: Payer: Commercial Managed Care - PPO | Admitting: Urology
